# Patient Record
Sex: Female | Born: 1980 | Race: White | Hispanic: No | Marital: Married | Smoking: Never smoker
Health system: Southern US, Community
[De-identification: ages and names within clinical notes are randomized; demographics above are authoritative.]

## PROBLEM LIST (undated history)

## (undated) DIAGNOSIS — A6920 Lyme disease, unspecified: Secondary | ICD-10-CM

## (undated) DIAGNOSIS — E039 Hypothyroidism, unspecified: Secondary | ICD-10-CM

## (undated) HISTORY — PX: WISDOM TOOTH EXTRACTION: SHX21

## (undated) HISTORY — PX: NO PAST SURGERIES: SHX2092

---

## 2010-05-06 ENCOUNTER — Inpatient Hospital Stay (HOSPITAL_COMMUNITY): Admission: AD | Admit: 2010-05-06 | Discharge: 2010-05-10 | Payer: Self-pay | Admitting: Obstetrics and Gynecology

## 2010-05-10 ENCOUNTER — Encounter: Admission: RE | Admit: 2010-05-10 | Discharge: 2010-06-04 | Payer: Self-pay | Admitting: Obstetrics and Gynecology

## 2011-01-22 LAB — CBC
HCT: 39.6 % (ref 36.0–46.0)
MCH: 35.5 pg — ABNORMAL HIGH (ref 26.0–34.0)
MCHC: 35.1 g/dL (ref 30.0–36.0)
MCV: 100 fL (ref 78.0–100.0)
MCV: 101.5 fL — ABNORMAL HIGH (ref 78.0–100.0)
Platelets: 144 10*3/uL — ABNORMAL LOW (ref 150–400)
Platelets: 159 10*3/uL (ref 150–400)
RDW: 13.5 % (ref 11.5–15.5)
RDW: 13.7 % (ref 11.5–15.5)

## 2011-01-22 LAB — RH IMMUNE GLOB WKUP(>/=20WKS)(NOT WOMEN'S HOSP)

## 2012-11-13 ENCOUNTER — Other Ambulatory Visit: Payer: Self-pay | Admitting: Family Medicine

## 2012-11-13 DIAGNOSIS — R1084 Generalized abdominal pain: Secondary | ICD-10-CM

## 2012-11-14 HISTORY — PX: HYSTEROSALPINGOGRAM: SHX6581

## 2012-11-15 ENCOUNTER — Ambulatory Visit
Admission: RE | Admit: 2012-11-15 | Discharge: 2012-11-15 | Disposition: A | Discharge status: Home / Self Care | Payer: BC Managed Care – HMO | Source: Ambulatory Visit | Attending: Family Medicine | Admitting: Family Medicine

## 2012-11-15 DIAGNOSIS — R1084 Generalized abdominal pain: Secondary | ICD-10-CM

## 2013-05-08 LAB — OB RESULTS CONSOLE HGB/HCT, BLOOD
HCT: 39 %
Hemoglobin: 14.2 g/dL

## 2013-05-08 LAB — OB RESULTS CONSOLE RPR: RPR: NONREACTIVE

## 2013-05-08 LAB — OB RESULTS CONSOLE HIV ANTIBODY (ROUTINE TESTING): HIV: NONREACTIVE

## 2013-05-08 LAB — OB RESULTS CONSOLE RUBELLA ANTIBODY, IGM: RUBELLA: IMMUNE

## 2013-05-08 LAB — OB RESULTS CONSOLE ABO/RH: RH TYPE: NEGATIVE

## 2013-05-08 LAB — OB RESULTS CONSOLE GBS: GBS: POSITIVE

## 2013-05-08 LAB — OB RESULTS CONSOLE ANTIBODY SCREEN: Antibody Screen: NEGATIVE

## 2013-05-08 LAB — OB RESULTS CONSOLE HEPATITIS B SURFACE ANTIGEN: HEP B S AG: NEGATIVE

## 2013-05-08 LAB — OB RESULTS CONSOLE PLATELET COUNT: Platelets: 267 10*3/uL

## 2013-06-02 LAB — OB RESULTS CONSOLE GC/CHLAMYDIA
Chlamydia: NEGATIVE
Gonorrhea: NEGATIVE

## 2013-09-26 ENCOUNTER — Encounter (HOSPITAL_COMMUNITY): Payer: Self-pay | Admitting: *Deleted

## 2013-09-26 ENCOUNTER — Inpatient Hospital Stay (HOSPITAL_COMMUNITY)
Admission: AD | Admit: 2013-09-26 | Discharge: 2013-09-27 | Disposition: A | Discharge status: Home / Self Care | Payer: BC Managed Care – PPO | Source: Ambulatory Visit | Attending: Obstetrics and Gynecology | Admitting: Obstetrics and Gynecology

## 2013-09-26 DIAGNOSIS — B373 Candidiasis of vulva and vagina: Secondary | ICD-10-CM

## 2013-09-26 DIAGNOSIS — O2390 Unspecified genitourinary tract infection in pregnancy, unspecified trimester: Secondary | ICD-10-CM | POA: Insufficient documentation

## 2013-09-26 DIAGNOSIS — O99891 Other specified diseases and conditions complicating pregnancy: Secondary | ICD-10-CM | POA: Insufficient documentation

## 2013-09-26 DIAGNOSIS — B3731 Acute candidiasis of vulva and vagina: Secondary | ICD-10-CM

## 2013-09-26 NOTE — MAU Note (Signed)
I was just falling asleep and leaked some water that woke me up. Called on-call nurse and told to come in. No pain. Only leaked fld the one time

## 2013-09-26 NOTE — MAU Provider Note (Signed)
  History     CSN: [id number]  Arrival date and time: 09/26/13 2300   First Provider Initiated Contact with Patient 09/26/13 2348      Chief Complaint  Patient presents with  . Rupture of Membranes   HPI [name] is a 32 y.o. G1P1000 at [redacted]w[redacted]d who presents to MAU today with complaint of ?LOF. The patient noted some clear fluid leaking earlier today. She has not seen any since then. She denies vaginal bleeding or contractions. She has noted some vulvar itching recently. She reports good fetal movement.   OB History   Grav Para Term Preterm Abortions TAB SAB Ect Mult Living   1 1 1       1       Past Medical History  Diagnosis Date  . Anxiety     History reviewed. No pertinent past surgical history.  History reviewed. No pertinent family history.  History  Substance Use Topics  . Smoking status: Never Smoker   . Smokeless tobacco: Not on file  . Alcohol Use: No    Allergies: No Known Allergies  No prescriptions prior to admission    Review of Systems  Constitutional: Negative for fever and malaise/fatigue.  Gastrointestinal: Negative for abdominal pain.  Genitourinary: Negative for dysuria and urgency.       + vaginal discharge, LOF Neg - vaginal bleeding   Physical Exam   Blood pressure 115/66, pulse 72, temperature 97.5 F (36.4 C), resp. rate 18, height 5\' 5"  (1.651 m), weight 150 lb 6.4 oz (68.221 kg), unknown if currently breastfeeding.  Physical Exam  Constitutional: She is oriented to person, place, and time. She appears well-developed and well-nourished. No distress.  HENT:  Head: Normocephalic.  Cardiovascular: Normal rate.   Respiratory: Effort normal.  Genitourinary: No bleeding around the vagina. Vaginal discharge (moderate amount of thick, white discharge coating the vaginal walls) found.  Neurological: She is alert and oriented to person, place, and time.  Skin: Skin is warm and dry. No erythema.  Psychiatric: She has a normal  mood and affect.  Pooling - negative Ferning - negative  Results for orders placed during the hospital encounter of 09/26/13 (from the past 24 hour(s))  WET PREP, GENITAL     Status: Abnormal   Collection Time    09/26/13 11:56 PM      Result Value Range   Yeast Wet Prep HPF POC FEW (*) NONE SEEN   Trich, Wet Prep NONE SEEN  NONE SEEN   Clue Cells Wet Prep HPF POC NONE SEEN  NONE SEEN   WBC, Wet Prep HPF POC MODERATE (*) NONE SEEN    Fetal Monitoring: Baseline: 120 bpm, moderate variability, + accelerations, no decelerations Contractions: None  MAU Course  Procedures None  MDM Discussed patient with Dr. Renaldo Fiddler. Treat yeast vulvovaginitis. Discuss preterm labor precautions and follow-up in the office as scheduled  Assessment and Plan  A: Yeast vulvovaginitis  P: Discharge home Rx for Diflucan given to patient Preterm labor precautions discussed Patient advised to follow-up in the office as scheduled Patient may return to MAU as needed or if her condition were to change or worsen  Brenda Starr, PA-C  09/27/2013, 1:02 AM

## 2013-09-27 ENCOUNTER — Encounter (HOSPITAL_COMMUNITY): Payer: Self-pay | Admitting: Medical

## 2013-09-27 LAB — WET PREP, GENITAL
Clue Cells Wet Prep HPF POC: NONE SEEN
Trich, Wet Prep: NONE SEEN

## 2013-09-27 MED ORDER — FLUCONAZOLE 150 MG PO TABS: 150.0000 mg | ORAL_TABLET | Freq: Every day | ORAL | Status: DC

## 2013-12-08 ENCOUNTER — Inpatient Hospital Stay (HOSPITAL_COMMUNITY): Payer: 59

## 2013-12-08 ENCOUNTER — Encounter (HOSPITAL_COMMUNITY): Payer: Self-pay | Admitting: *Deleted

## 2013-12-08 ENCOUNTER — Inpatient Hospital Stay (HOSPITAL_COMMUNITY)
Admission: AD | Admit: 2013-12-08 | Discharge: 2013-12-09 | Disposition: A | Discharge status: Home / Self Care | Payer: 59 | Source: Ambulatory Visit | Attending: Obstetrics and Gynecology | Admitting: Obstetrics and Gynecology

## 2013-12-08 DIAGNOSIS — O9989 Other specified diseases and conditions complicating pregnancy, childbirth and the puerperium: Principal | ICD-10-CM

## 2013-12-08 DIAGNOSIS — O99891 Other specified diseases and conditions complicating pregnancy: Secondary | ICD-10-CM

## 2013-12-08 DIAGNOSIS — W108XXA Fall (on) (from) other stairs and steps, initial encounter: Secondary | ICD-10-CM | POA: Insufficient documentation

## 2013-12-08 DIAGNOSIS — Y92009 Unspecified place in unspecified non-institutional (private) residence as the place of occurrence of the external cause: Secondary | ICD-10-CM | POA: Insufficient documentation

## 2013-12-08 DIAGNOSIS — M549 Dorsalgia, unspecified: Secondary | ICD-10-CM | POA: Insufficient documentation

## 2013-12-08 DIAGNOSIS — W19XXXA Unspecified fall, initial encounter: Secondary | ICD-10-CM

## 2013-12-08 DIAGNOSIS — O479 False labor, unspecified: Secondary | ICD-10-CM | POA: Insufficient documentation

## 2013-12-08 NOTE — MAU Provider Note (Signed)
  History     CSN: [id number]  Arrival date and time: 12/08/13 2028   First Provider Initiated Contact with Patient 12/08/13 2218      Chief Complaint  Patient presents with  . Fall   HPI [name] is a 33 y.o. G2P1001 at [redacted]w[redacted]d who presents to MAU today after a fall at home. The patient states that she fell going up concrete steps outside her home around 1930 today. She denies vaginal bleeding, discharge, LOF or abdominal pain. She reports good fetal movement. She states mild back pain that she feels is muscle strain from the fall. She reports occasional, mild contractions.   OB History   Grav Para Term Preterm Abortions TAB SAB Ect Mult Living   2 1 1       1       Past Medical History  Diagnosis Date  . Anxiety     History reviewed. No pertinent past surgical history.  History reviewed. No pertinent family history.  History  Substance Use Topics  . Smoking status: Never Smoker   . Smokeless tobacco: Not on file  . Alcohol Use: No    Allergies: No Known Allergies  Prescriptions prior to admission  Medication Sig Dispense Refill  . busPIRone (BUSPAR) 15 MG tablet Take 15 mg by mouth daily.       . Calcium Carb-Cholecalciferol (CALCIUM + D3) 600-200 MG-UNIT TABS Take 1 tablet by mouth 2 (two) times daily.      . calcium carbonate (TUMS - DOSED IN MG ELEMENTAL CALCIUM) 500 MG chewable tablet Chew 2 tablets by mouth 4 (four) times daily as needed for indigestion or heartburn.      . Omega-3 Fatty Acids (FISH OIL PO) Take 1 tablet by mouth 2 (two) times daily.      . Prenatal Vit-Fe Fumarate-FA (PRENATAL MULTIVITAMIN) TABS tablet Take 1 tablet by mouth daily at 12 noon.        Review of Systems  Constitutional: Negative for fever and malaise/fatigue.  Gastrointestinal: Negative for abdominal pain.  Genitourinary:       Neg - vaginal bleeding, LOF   Physical Exam   Blood pressure 118/73, pulse 82, temperature 97.6 F (36.4 C), temperature source Oral,  resp. rate 18, height 5\' 5"  (1.651 m), weight 166 lb 3.2 oz (75.388 kg).  Physical Exam  Constitutional: She appears well-developed and well-nourished. No distress.  HENT:  Head: Normocephalic and atraumatic.  Cardiovascular: Normal rate.   Respiratory: Effort normal.  GI: Soft. She exhibits no distension and no mass. There is no tenderness. There is no rebound and no guarding.  Skin: Skin is warm and dry. No erythema.  Psychiatric: She has a normal mood and affect.    Fetal Monitoring: Baseline: 120 bpm, moderate variability, + accelerations, no decelerations Contractions: irregular, mild MAU Course  Procedures None  MDM Discussed with Dr. Radene Knee. [place] and 4 hours of NST monitoring [place] results show NO placental abruption, normal AFI 4 hour course of fetal monitoring complete. Minimal occasional, irregular contractions noted. Reactive NST Assessment and Plan  A: Fall in pregnancy Reactive NST  P: Discharge home Patient advised to take Tylenol PRN for pain Patient advised to keep scheduled appointment for routine prenatal care Labor precautions and kick counts discussed Patient may return to MAU as needed or if her condition were to change or worsen  Farris Has, PA-C  12/08/2013, 11:48 PM

## 2013-12-08 NOTE — MAU Note (Signed)
Pt c/o irregular uc that are not painful. States when she first fell, it felt as if her stomach would not relax. States that it feels "tighter" than usual.

## 2013-12-08 NOTE — MAU Note (Signed)
Pt G2 P1 at 39.1, fell going upstairs, landed on abdomen.  Pt having soreness, +fetal movement.

## 2013-12-09 NOTE — Progress Notes (Signed)
MFM ultrasound  Indication: 33 yr old G2P1001 at [redacted]w[redacted]d s/p fall for fetal ultrasound. Remote read.  Findings: 1. Single intrauterine pregnancy. 2. Fundal placenta without evidence of previa. No retroplacental bleed is seen. 3. Normal amniotic fluid index.  Recommendations: 1. Normal AFI. 2. No retroplacental bleed is seen. 3. Management per OB team.  [place], MD

## 2013-12-09 NOTE — Discharge Instructions (Signed)
Braxton Hicks Contractions Pregnancy is commonly associated with contractions of the uterus throughout the pregnancy. Towards the end of pregnancy (32 to 34 weeks), these contractions (Braxton Hicks) can develop more often and may become more forceful. This is not true labor because these contractions do not result in opening (dilatation) and thinning of the cervix. They are sometimes difficult to tell apart from true labor because these contractions can be forceful and people have different pain tolerances. You should not feel embarrassed if you go to the hospital with false labor. Sometimes, the only way to tell if you are in true labor is for your caregiver to follow the changes in the cervix. How to tell the difference between true and false labor:  False labor.  The contractions of false labor are usually shorter, irregular and not as hard as those of true labor.  They are often felt in the front of the lower abdomen and in the groin.  They may leave with walking around or changing positions while lying down.  They get weaker and are shorter lasting as time goes on.  These contractions are usually irregular.  They do not usually become progressively stronger, regular and closer together as with true labor.  True labor.  Contractions in true labor last 30 to 70 seconds, become very regular, usually become more intense, and increase in frequency.  They do not go away with walking.  The discomfort is usually felt in the top of the uterus and spreads to the lower abdomen and low back.  True labor can be determined by your caregiver with an exam. This will show that the cervix is dilating and getting thinner. If there are no prenatal problems or other health problems associated with the pregnancy, it is completely safe to be sent home with false labor and await the onset of true labor. HOME CARE INSTRUCTIONS   Keep up with your usual exercises and instructions.  Take medications as  directed.  Keep your regular prenatal appointment.  Eat and drink lightly if you think you are going into labor.  If BH contractions are making you uncomfortable:  Change your activity position from lying down or resting to walking/walking to resting.  Sit and rest in a tub of warm water.  Drink 2 to 3 glasses of water. Dehydration may cause B-H contractions.  Do slow and deep breathing several times an hour. SEEK IMMEDIATE MEDICAL CARE IF:   Your contractions continue to become stronger, more regular, and closer together.  You have a gushing, burst or leaking of fluid from the vagina.  An oral temperature above 102 F (38.9 C) develops.  You have passage of blood-tinged mucus.  You develop vaginal bleeding.  You develop continuous belly (abdominal) pain.  You have low back pain that you never had before.  You feel the baby's head pushing down causing pelvic pressure.  The baby is not moving as much as it used to. Document Released: 10/23/2005 Document Revised: 01/15/2012 Document Reviewed: 08/04/2013 [employer] Patient Information 2014 [employer].  Fetal Movement Counts Patient Name: __________________________________________________ Patient Due Date: ____________________ Performing a fetal movement count is highly recommended in high-risk pregnancies, but it is good for every pregnant woman to do. Your caregiver may ask you to start counting fetal movements at 28 weeks of the pregnancy. Fetal movements often increase:  After eating a full meal.  After physical activity.  After eating or drinking something sweet or cold.  At rest. Pay attention to when you feel   the baby is most active. This will help you notice a pattern of your baby's sleep and wake cycles and what factors contribute to an increase in fetal movement. It is important to perform a fetal movement count at the same time each day when your baby is normally most active.  HOW TO COUNT FETAL  MOVEMENTS 1. Find a quiet and comfortable area to sit or lie down on your left side. Lying on your left side provides the best blood and oxygen circulation to your baby. 2. Write down the day and time on a sheet of paper or in a journal. 3. Start counting kicks, flutters, swishes, rolls, or jabs in a 2 hour period. You should feel at least 10 movements within 2 hours. 4. If you do not feel 10 movements in 2 hours, wait 2 3 hours and count again. Look for a change in the pattern or not enough counts in 2 hours. SEEK MEDICAL CARE IF:  You feel less than 10 counts in 2 hours, tried twice.  There is no movement in over an hour.  The pattern is changing or taking longer each day to reach 10 counts in 2 hours.  You feel the baby is not moving as he or she usually does. Date: ____________ Movements: ____________ Start time: ____________ Finish time: ____________  Date: ____________ Movements: ____________ Start time: ____________ Finish time: ____________ Date: ____________ Movements: ____________ Start time: ____________ Finish time: ____________ Date: ____________ Movements: ____________ Start time: ____________ Finish time: ____________ Date: ____________ Movements: ____________ Start time: ____________ Finish time: ____________ Date: ____________ Movements: ____________ Start time: ____________ Finish time: ____________ Date: ____________ Movements: ____________ Start time: ____________ Finish time: ____________ Date: ____________ Movements: ____________ Start time: ____________ Finish time: ____________  Date: ____________ Movements: ____________ Start time: ____________ Finish time: ____________ Date: ____________ Movements: ____________ Start time: ____________ Finish time: ____________ Date: ____________ Movements: ____________ Start time: ____________ Finish time: ____________ Date: ____________ Movements: ____________ Start time: ____________ Finish time: ____________ Date: ____________  Movements: ____________ Start time: ____________ Finish time: ____________ Date: ____________ Movements: ____________ Start time: ____________ Finish time: ____________ Date: ____________ Movements: ____________ Start time: ____________ Finish time: ____________  Date: ____________ Movements: ____________ Start time: ____________ Finish time: ____________ Date: ____________ Movements: ____________ Start time: ____________ Finish time: ____________ Date: ____________ Movements: ____________ Start time: ____________ Finish time: ____________ Date: ____________ Movements: ____________ Start time: ____________ Finish time: ____________ Date: ____________ Movements: ____________ Start time: ____________ Finish time: ____________ Date: ____________ Movements: ____________ Start time: ____________ Finish time: ____________ Date: ____________ Movements: ____________ Start time: ____________ Finish time: ____________  Date: ____________ Movements: ____________ Start time: ____________ Finish time: ____________ Date: ____________ Movements: ____________ Start time: ____________ Finish time: ____________ Date: ____________ Movements: ____________ Start time: ____________ Finish time: ____________ Date: ____________ Movements: ____________ Start time: ____________ Finish time: ____________ Date: ____________ Movements: ____________ Start time: ____________ Finish time: ____________ Date: ____________ Movements: ____________ Start time: ____________ Finish time: ____________ Date: ____________ Movements: ____________ Start time: ____________ Finish time: ____________  Date: ____________ Movements: ____________ Start time: ____________ Finish time: ____________ Date: ____________ Movements: ____________ Start time: ____________ Finish time: ____________ Date: ____________ Movements: ____________ Start time: ____________ Finish time: ____________ Date: ____________ Movements: ____________ Start time:  ____________ Finish time: ____________ Date: ____________ Movements: ____________ Start time: ____________ Finish time: ____________ Date: ____________ Movements: ____________ Start time: ____________ Finish time: ____________ Date: ____________ Movements: ____________ Start time: ____________ Finish time: ____________  Date: ____________ Movements: ____________ Start time: ____________ Finish time: ____________ Date: ____________ Movements: ____________ Start   time: ____________ Finish time: ____________ Date: ____________ Movements: ____________ Start time: ____________ Finish time: ____________ Date: ____________ Movements: ____________ Start time: ____________ Finish time: ____________ Date: ____________ Movements: ____________ Start time: ____________ Finish time: ____________ Date: ____________ Movements: ____________ Start time: ____________ Finish time: ____________ Date: ____________ Movements: ____________ Start time: ____________ Finish time: ____________  Date: ____________ Movements: ____________ Start time: ____________ Finish time: ____________ Date: ____________ Movements: ____________ Start time: ____________ Finish time: ____________ Date: ____________ Movements: ____________ Start time: ____________ Finish time: ____________ Date: ____________ Movements: ____________ Start time: ____________ Finish time: ____________ Date: ____________ Movements: ____________ Start time: ____________ Finish time: ____________ Date: ____________ Movements: ____________ Start time: ____________ Finish time: ____________ Date: ____________ Movements: ____________ Start time: ____________ Finish time: ____________  Date: ____________ Movements: ____________ Start time: ____________ Finish time: ____________ Date: ____________ Movements: ____________ Start time: ____________ Finish time: ____________ Date: ____________ Movements: ____________ Start time: ____________ Finish time: ____________ Date:  ____________ Movements: ____________ Start time: ____________ Finish time: ____________ Date: ____________ Movements: ____________ Start time: ____________ Finish time: ____________ Date: ____________ Movements: ____________ Start time: ____________ Finish time: ____________ Document Released: 11/22/2006 Document Revised: 10/09/2012 Document Reviewed: 08/19/2012 [employer] Patient Information 2014 [employer].  

## 2013-12-10 ENCOUNTER — Encounter (HOSPITAL_COMMUNITY): Payer: Self-pay | Admitting: *Deleted

## 2013-12-10 ENCOUNTER — Inpatient Hospital Stay (HOSPITAL_COMMUNITY): Payer: 59 | Admitting: Anesthesiology

## 2013-12-10 ENCOUNTER — Encounter (HOSPITAL_COMMUNITY): Payer: 59 | Admitting: Anesthesiology

## 2013-12-10 ENCOUNTER — Inpatient Hospital Stay (HOSPITAL_COMMUNITY)
Admission: AD | Admit: 2013-12-10 | Discharge: 2013-12-12 | DRG: 775 | Disposition: A | Discharge status: Home / Self Care | Payer: 59 | Source: Ambulatory Visit | Attending: Obstetrics and Gynecology | Admitting: Obstetrics and Gynecology

## 2013-12-10 DIAGNOSIS — O9989 Other specified diseases and conditions complicating pregnancy, childbirth and the puerperium: Secondary | ICD-10-CM

## 2013-12-10 DIAGNOSIS — IMO0001 Reserved for inherently not codable concepts without codable children: Secondary | ICD-10-CM

## 2013-12-10 DIAGNOSIS — Z22330 Carrier of Group B streptococcus: Secondary | ICD-10-CM

## 2013-12-10 DIAGNOSIS — O99344 Other mental disorders complicating childbirth: Secondary | ICD-10-CM | POA: Diagnosis present

## 2013-12-10 DIAGNOSIS — F411 Generalized anxiety disorder: Secondary | ICD-10-CM | POA: Diagnosis present

## 2013-12-10 DIAGNOSIS — O99892 Other specified diseases and conditions complicating childbirth: Secondary | ICD-10-CM | POA: Diagnosis present

## 2013-12-10 LAB — CBC
HEMATOCRIT: 39.1 % (ref 36.0–46.0)
HEMOGLOBIN: 14.3 g/dL (ref 12.0–15.0)
MCH: 34.7 pg — AB (ref 26.0–34.0)
MCHC: 36.6 g/dL — AB (ref 30.0–36.0)
MCV: 94.9 fL (ref 78.0–100.0)
Platelets: 179 10*3/uL (ref 150–400)
RBC: 4.12 MIL/uL (ref 3.87–5.11)
RDW: 12.4 % (ref 11.5–15.5)
WBC: 17.7 10*3/uL — ABNORMAL HIGH (ref 4.0–10.5)

## 2013-12-10 MED ORDER — EPHEDRINE 5 MG/ML INJ
10.0000 mg | INTRAVENOUS | Status: DC | PRN
  Filled 2013-12-10: qty 2
  Filled 2013-12-10: qty 4

## 2013-12-10 MED ORDER — DIPHENHYDRAMINE HCL 50 MG/ML IJ SOLN: 12.5000 mg | INTRAMUSCULAR | Status: DC | PRN

## 2013-12-10 MED ORDER — MEASLES, MUMPS & RUBELLA VAC ~~LOC~~ INJ
0.5000 mL | INJECTION | Freq: Once | SUBCUTANEOUS | Status: DC
Start: 2013-12-11 — End: 2013-12-12
  Filled 2013-12-10: qty 0.5

## 2013-12-10 MED ORDER — OXYCODONE-ACETAMINOPHEN 5-325 MG PO TABS
1.0000 | ORAL_TABLET | ORAL | Status: DC | PRN
  Administered 2013-12-11 (×2): 2 via ORAL
  Filled 2013-12-10 (×2): qty 2

## 2013-12-10 MED ORDER — BUPIVACAINE HCL (PF) 0.25 % IJ SOLN
INTRAMUSCULAR | Status: DC | PRN
  Administered 2013-12-10 (×2): 5 mL

## 2013-12-10 MED ORDER — IBUPROFEN 600 MG PO TABS: 600.0000 mg | ORAL_TABLET | Freq: Four times a day (QID) | ORAL | Status: DC | PRN

## 2013-12-10 MED ORDER — ONDANSETRON HCL 4 MG/2ML IJ SOLN
4.0000 mg | Freq: Four times a day (QID) | INTRAMUSCULAR | Status: DC | PRN
Start: 2013-12-10 — End: 2013-12-10
  Administered 2013-12-10: 4 mg via INTRAVENOUS
  Filled 2013-12-10: qty 2

## 2013-12-10 MED ORDER — CITRIC ACID-SODIUM CITRATE 334-500 MG/5ML PO SOLN: 30.0000 mL | ORAL | Status: DC | PRN

## 2013-12-10 MED ORDER — LACTATED RINGERS IV SOLN
INTRAVENOUS | Status: DC
  Administered 2013-12-10: 125 mL/h via INTRAVENOUS

## 2013-12-10 MED ORDER — OXYCODONE-ACETAMINOPHEN 5-325 MG PO TABS: 1.0000 | ORAL_TABLET | ORAL | Status: DC | PRN

## 2013-12-10 MED ORDER — FENTANYL 2.5 MCG/ML BUPIVACAINE 1/10 % EPIDURAL INFUSION (WH - ANES)
14.0000 mL/h | INTRAMUSCULAR | Status: DC | PRN
  Administered 2013-12-10: 14 mL/h via EPIDURAL
  Filled 2013-12-10: qty 125

## 2013-12-10 MED ORDER — LIDOCAINE HCL (PF) 1 % IJ SOLN
30.0000 mL | INTRAMUSCULAR | Status: DC | PRN
  Filled 2013-12-10 (×2): qty 30

## 2013-12-10 MED ORDER — ACETAMINOPHEN 325 MG PO TABS
650.0000 mg | ORAL_TABLET | ORAL | Status: DC | PRN
  Administered 2013-12-10: 650 mg via ORAL
  Filled 2013-12-10: qty 2

## 2013-12-10 MED ORDER — DIBUCAINE 1 % RE OINT: 1.0000 "application " | TOPICAL_OINTMENT | RECTAL | Status: DC | PRN

## 2013-12-10 MED ORDER — PHENYLEPHRINE 40 MCG/ML (10ML) SYRINGE FOR IV PUSH (FOR BLOOD PRESSURE SUPPORT)
80.0000 ug | PREFILLED_SYRINGE | INTRAVENOUS | Status: DC | PRN
Start: 2013-12-10 — End: 2013-12-10
  Filled 2013-12-10: qty 2

## 2013-12-10 MED ORDER — PHENYLEPHRINE 40 MCG/ML (10ML) SYRINGE FOR IV PUSH (FOR BLOOD PRESSURE SUPPORT)
80.0000 ug | PREFILLED_SYRINGE | INTRAVENOUS | Status: DC | PRN
  Filled 2013-12-10: qty 10
  Filled 2013-12-10: qty 2

## 2013-12-10 MED ORDER — WITCH HAZEL-GLYCERIN EX PADS: 1.0000 "application " | MEDICATED_PAD | CUTANEOUS | Status: DC | PRN

## 2013-12-10 MED ORDER — ONDANSETRON HCL 4 MG PO TABS: 4.0000 mg | ORAL_TABLET | ORAL | Status: DC | PRN

## 2013-12-10 MED ORDER — MEDROXYPROGESTERONE ACETATE 150 MG/ML IM SUSP: 150.0000 mg | INTRAMUSCULAR | Status: DC | PRN

## 2013-12-10 MED ORDER — DEXTROSE 5 % IV SOLN
5.0000 10*6.[IU] | Freq: Once | INTRAVENOUS | Status: AC
  Administered 2013-12-10: 5 10*6.[IU] via INTRAVENOUS
  Filled 2013-12-10: qty 5

## 2013-12-10 MED ORDER — LANOLIN HYDROUS EX OINT: TOPICAL_OINTMENT | CUTANEOUS | Status: DC | PRN

## 2013-12-10 MED ORDER — IBUPROFEN 600 MG PO TABS
600.0000 mg | ORAL_TABLET | Freq: Four times a day (QID) | ORAL | Status: DC
  Administered 2013-12-10 – 2013-12-12 (×6): 600 mg via ORAL
  Filled 2013-12-10 (×6): qty 1

## 2013-12-10 MED ORDER — SIMETHICONE 80 MG PO CHEW: 80.0000 mg | CHEWABLE_TABLET | ORAL | Status: DC | PRN

## 2013-12-10 MED ORDER — BUSPIRONE HCL 15 MG PO TABS
15.0000 mg | ORAL_TABLET | Freq: Every day | ORAL | Status: DC
  Administered 2013-12-11: 15 mg via ORAL
  Filled 2013-12-10 (×2): qty 1

## 2013-12-10 MED ORDER — PENICILLIN G POTASSIUM 5000000 UNITS IJ SOLR
2.5000 10*6.[IU] | INTRAVENOUS | Status: DC
  Administered 2013-12-10: 2.5 10*6.[IU] via INTRAVENOUS
  Filled 2013-12-10 (×3): qty 2.5

## 2013-12-10 MED ORDER — DIPHENHYDRAMINE HCL 25 MG PO CAPS: 25.0000 mg | ORAL_CAPSULE | Freq: Four times a day (QID) | ORAL | Status: DC | PRN

## 2013-12-10 MED ORDER — OXYTOCIN BOLUS FROM INFUSION
500.0000 mL | INTRAVENOUS | Status: DC
  Administered 2013-12-10: 500 mL via INTRAVENOUS

## 2013-12-10 MED ORDER — EPHEDRINE 5 MG/ML INJ
10.0000 mg | INTRAVENOUS | Status: DC | PRN
  Filled 2013-12-10: qty 2

## 2013-12-10 MED ORDER — BENZOCAINE-MENTHOL 20-0.5 % EX AERO
1.0000 "application " | INHALATION_SPRAY | CUTANEOUS | Status: DC | PRN
  Administered 2013-12-10: 1 via TOPICAL
  Filled 2013-12-10: qty 56

## 2013-12-10 MED ORDER — PRENATAL MULTIVITAMIN CH
1.0000 | ORAL_TABLET | Freq: Every day | ORAL | Status: DC
  Filled 2013-12-10: qty 1

## 2013-12-10 MED ORDER — OXYTOCIN 40 UNITS IN LACTATED RINGERS INFUSION - SIMPLE MED
62.5000 mL/h | INTRAVENOUS | Status: DC
  Filled 2013-12-10: qty 1000

## 2013-12-10 MED ORDER — ONDANSETRON HCL 4 MG/2ML IJ SOLN: 4.0000 mg | INTRAMUSCULAR | Status: DC | PRN

## 2013-12-10 MED ORDER — LACTATED RINGERS IV SOLN
500.0000 mL | Freq: Once | INTRAVENOUS | Status: DC
Start: 2013-12-10 — End: 2013-12-10

## 2013-12-10 MED ORDER — SENNOSIDES-DOCUSATE SODIUM 8.6-50 MG PO TABS
2.0000 | ORAL_TABLET | ORAL | Status: DC
  Administered 2013-12-10 – 2013-12-11 (×2): 2 via ORAL
  Filled 2013-12-10 (×2): qty 2

## 2013-12-10 MED ORDER — LACTATED RINGERS IV SOLN
500.0000 mL | INTRAVENOUS | Status: DC | PRN
  Administered 2013-12-10: 1000 mL via INTRAVENOUS

## 2013-12-10 MED ORDER — TETANUS-DIPHTH-ACELL PERTUSSIS 5-2.5-18.5 LF-MCG/0.5 IM SUSP: 0.5000 mL | Freq: Once | INTRAMUSCULAR | Status: DC

## 2013-12-10 MED ORDER — LIDOCAINE HCL (PF) 1 % IJ SOLN
INTRAMUSCULAR | Status: DC | PRN
  Administered 2013-12-10 (×4): 4 mL

## 2013-12-10 NOTE — Anesthesia Preprocedure Evaluation (Signed)
Anesthesia Evaluation  Patient identified by MRN, date of birth, ID band Patient awake    Reviewed: Allergy & Precautions, H&P , NPO status , Patient's Chart, lab work & pertinent test results, reviewed documented beta blocker date and time   History of Anesthesia Complications Negative for: history of anesthetic complications  Airway Mallampati: III TM Distance: >3 FB Neck ROM: full    Dental  (+) Teeth Intact   Pulmonary neg pulmonary ROS,  breath sounds clear to auscultation        Cardiovascular negative cardio ROS  Rhythm:regular Rate:Normal     Neuro/Psych PSYCHIATRIC DISORDERS (anxiety) negative neurological ROS     GI/Hepatic negative GI ROS, Neg liver ROS,   Endo/Other  negative endocrine ROS  Renal/GU negative Renal ROS     Musculoskeletal   Abdominal   Peds  Hematology negative hematology ROS (+)   Anesthesia Other Findings   Reproductive/Obstetrics (+) Pregnancy                           Anesthesia Physical Anesthesia Plan  ASA: II  Anesthesia Plan: Epidural   Post-op Pain Management:    Induction:   Airway Management Planned:   Additional Equipment:   Intra-op Plan:   Post-operative Plan:   Informed Consent: I have reviewed the patients History and Physical, chart, labs and discussed the procedure including the risks, benefits and alternatives for the proposed anesthesia with the patient or authorized representative who has indicated his/her understanding and acceptance.     Plan Discussed with:   Anesthesia Plan Comments:         Anesthesia Quick Evaluation

## 2013-12-10 NOTE — Progress Notes (Signed)
SVD of vigerous female infant w/ apgars of 8,9.  Mild <18min shoulder dystocia resolved with McRoberts and suprapubic pressure Placenta delivered spontaneous w/ 3VC.   2nd degree MLE repaired w/ 3-0 vicryl rapide.  Fundus firm.  EBL 400cc .

## 2013-12-10 NOTE — Anesthesia Procedure Notes (Signed)
Epidural Patient location during procedure: OB Start time: 12/10/2013 2:56 PM  Staffing Performed by: anesthesiologist   Preanesthetic Checklist Completed: patient identified, site marked, surgical consent, pre-op evaluation, timeout performed, IV checked, risks and benefits discussed and monitors and equipment checked  Epidural Patient position: sitting Prep: site prepped and draped and DuraPrep Patient monitoring: continuous pulse ox and blood pressure Approach: midline Injection technique: LOR air  Needle:  Needle type: Tuohy  Needle gauge: 17 G Needle length: 9 cm and 9 Needle insertion depth: 4 cm Catheter type: closed end flexible Catheter size: 19 Gauge Catheter at skin depth: 9 cm Test dose: negative  Assessment Events: blood not aspirated, injection not painful, no injection resistance, negative IV test and no paresthesia  Additional Notes Discussed risk of headache, infection, bleeding, nerve injury and failed or incomplete block.  Patient voices understanding and wishes to proceed.  Epidural placed easily on first attempt. No paresthesia.  Patient tolerated procedure well with no apparent complications.  Charlton Haws, MDReason for block:procedure for pain

## 2013-12-10 NOTE — H&P (Signed)
Brenda Bryant is a 33 y.o. female presenting for labor.  Pt reports contractions began overnight and have become stronger and closer throughout the day.  No lof or vb.  Pregnancy uncomplicated.  History OB History   Grav Para Term Preterm Abortions TAB SAB Ect Mult Living   6 1 1  4  4   1      Past Medical History  Diagnosis Date  . Anxiety    History reviewed. No pertinent past surgical history. Family History: family history is not on file. Social History:  reports that she has never smoked. She has never used smokeless tobacco. She reports that she does not drink alcohol or use illicit drugs.   Prenatal Transfer Tool  Maternal Diabetes: No Genetic Screening: Normal Maternal Ultrasounds/Referrals: Normal Fetal Ultrasounds or other Referrals:  None Maternal Substance Abuse:  No Significant Maternal Medications:  None Significant Maternal Lab Results:  None Other Comments:  None  ROS  Dilation: 6 Effacement (%): 100 Station: 0 Exam by:: Philis Pique, RN Blood pressure 109/57, pulse 76, temperature 98.1 F (36.7 C), temperature source Oral, resp. rate 18, height 5\' 5"  (1.651 m), weight 74.844 kg (165 lb), SpO2 100.00%. Exam Physical Exam  NAD - uncomfortable with ctx Abd - gravid, firm during ctx, NT Cvx - 3-4 cm on admit.  8 cm now Prenatal labs: ABO, Rh: A/Negative/-- (07/03 0000) Antibody: Negative (07/03 0000) Rubella: Immune (07/03 0000) RPR: Nonreactive (07/03 0000)  HBsAg: Negative (07/03 0000)  HIV: Non-reactive (07/03 0000)  GBS: Positive (07/03 0000)   Assessment/Plan: Admit Epidural prn Exp mngt PCN   Elan Mcelvain 12/10/2013, 5:18 PM

## 2013-12-10 NOTE — MAU Note (Signed)
Contractions, maybe every 5 min. No bleeding or leaking.  Was 3/90 when last checked.

## 2013-12-11 ENCOUNTER — Encounter (HOSPITAL_COMMUNITY): Payer: Self-pay

## 2013-12-11 LAB — CBC
HCT: 35.2 % — ABNORMAL LOW (ref 36.0–46.0)
HEMOGLOBIN: 12.6 g/dL (ref 12.0–15.0)
MCH: 34.5 pg — ABNORMAL HIGH (ref 26.0–34.0)
MCHC: 35.8 g/dL (ref 30.0–36.0)
MCV: 96.4 fL (ref 78.0–100.0)
Platelets: 169 10*3/uL (ref 150–400)
RBC: 3.65 MIL/uL — AB (ref 3.87–5.11)
RDW: 12.6 % (ref 11.5–15.5)
WBC: 20 10*3/uL — ABNORMAL HIGH (ref 4.0–10.5)

## 2013-12-11 LAB — KLEIHAUER-BETKE STAIN
# Vials RhIg: 1
Fetal Cells %: 0 %
Quantitation Fetal Hemoglobin: 0 mL

## 2013-12-11 LAB — RPR: RPR Ser Ql: NONREACTIVE

## 2013-12-11 MED ORDER — RHO D IMMUNE GLOBULIN 1500 UNIT/2ML IJ SOLN
300.0000 ug | Freq: Once | INTRAMUSCULAR | Status: AC
  Administered 2013-12-11: 300 ug via INTRAMUSCULAR
  Filled 2013-12-11: qty 2

## 2013-12-11 NOTE — Progress Notes (Signed)
Post Partum Day 1 Subjective: no complaints and up ad lib  Objective: Blood pressure 100/64, pulse 70, temperature 97.7 F (36.5 C), temperature source Oral, resp. rate 18, height 5\' 5"  (1.651 m), weight 165 lb (74.844 kg), SpO2 100.00%, unknown if currently breastfeeding.  Physical Exam:  General: alert and cooperative Lochia: appropriate Uterine Fundus: firm Incision: perineum intact DVT Evaluation: No evidence of DVT seen on physical exam. Negative Homan's sign. No cords or calf tenderness. No significant calf/ankle edema.   Recent Labs  12/10/13 1350 12/11/13 0600  HGB 14.3 12.6  HCT 39.1 35.2*    Assessment/Plan: Plan for discharge tomorrow and Circumcision prior to discharge   LOS: 1 day   CURTIS,CAROL G 12/11/2013, 8:14 AM

## 2013-12-11 NOTE — Anesthesia Postprocedure Evaluation (Signed)
  Anesthesia Post-op Note  Patient: Brenda Bryant  Procedure(s) Performed: * No procedures listed *  Patient Location: PACU and Mother/Baby  Anesthesia Type:Epidural  Level of Consciousness: awake, alert  and oriented  Airway and Oxygen Therapy: Patient Spontanous Breathing  Post-op Pain: none  Post-op Assessment: Post-op Vital signs reviewed, Patient's Cardiovascular Status Stable, No headache, No backache, No residual numbness and No residual motor weakness  Post-op Vital Signs: Reviewed and stable  Complications: No apparent anesthesia complications

## 2013-12-11 NOTE — Progress Notes (Signed)
Patient was referred for history of depression/anxiety. * Referral screened out by Clinical Social Worker because none of the following criteria appear to apply: ~ History of anxiety/depression during this pregnancy, or of post-partum depression. ~ Diagnosis of anxiety and/or depression within last 3 years ~ History of depression due to pregnancy loss/loss of child OR * Patient's symptoms currently being treated with medication and/or therapy. Please contact the Clinical Social Worker if needs arise, or if patient requests.  MOB has rx for Buspar.

## 2013-12-11 NOTE — Lactation Note (Signed)
This note was copied from the chart of [facility]. Lactation Consultation Note Follow up  visit at 26 hours of age.  Mom Reports just finishing a feeding, baby asleep in dads arms.  Mom complains of sore nipples and feels baby is not opening mouth big enough for latch.  Encouraged mom to awaken baby and techniques taught.  Encouraged mom to use EBM on nipples.  Encouraged to feed with early cues and offer skin to skin if baby does not wake after 3 hours.  Baby had circumcision this evening.  Mom to call for assist as needed.    Patient Name: Brenda Bryant Today's Date: 12/11/2013 Reason for consult: Initial assessment   Maternal Data Formula Feeding for Exclusion: No Has patient been taught Hand Expression?: Yes Does the patient have breastfeeding experience prior to this delivery?: Yes  Feeding Feeding Type: Breast Fed Length of feed: 0 min (attempted. to sleepy)  LATCH Score/Interventions                Intervention(s): Breastfeeding basics reviewed     Lactation Tools Discussed/Used     Consult Status Consult Status: Follow-up Date: 12/12/13 Follow-up type: In-patient    Justice Britain 12/11/2013, 10:02 PM

## 2013-12-12 LAB — RH IG WORKUP (INCLUDES ABO/RH)
ABO/RH(D): A NEG
Antibody Screen: NEGATIVE
Gestational Age(Wks): 39.4
UNIT DIVISION: 0

## 2013-12-12 MED ORDER — PRENATAL MULTIVITAMIN CH: 1.0000 | ORAL_TABLET | Freq: Every day | ORAL | Status: DC

## 2013-12-12 MED ORDER — IBUPROFEN 600 MG PO TABS: 600.0000 mg | ORAL_TABLET | Freq: Four times a day (QID) | ORAL | Status: DC

## 2013-12-12 MED ORDER — BUSPIRONE HCL 15 MG PO TABS: 15.0000 mg | ORAL_TABLET | Freq: Every day | ORAL | Status: DC

## 2013-12-12 NOTE — Discharge Summary (Signed)
Obstetric Discharge Summary Reason for Admission: onset of labor Prenatal Procedures: ultrasound Intrapartum Procedures: spontaneous vaginal delivery Postpartum Procedures: none Complications-Operative and Postpartum: 2 degree perineal laceration Hemoglobin  Date Value Range Status  12/11/2013 12.6  12.0 - 15.0 g/dL Final  05/08/2013 14.2   Final     HCT  Date Value Range Status  12/11/2013 35.2* 36.0 - 46.0 % Final  05/08/2013 39   Final    Physical Exam:  General: alert and cooperative Lochia: appropriate Uterine Fundus: firm Incision: perineum intact DVT Evaluation: No evidence of DVT seen on physical exam. Negative Homan's sign. No cords or calf tenderness. No significant calf/ankle edema.  Discharge Diagnoses: Term Pregnancy-delivered  Discharge Information: Date: 12/12/2013 Activity: pelvic rest Diet: routine Medications: PNV, Ibuprofen and buspar Condition: stable Instructions: refer to practice specific booklet Discharge to: home   Newborn Data: Live born female  Birth Weight: 8 lb 12.6 oz (3986 g) APGAR: 8, 9  Home with mother.  Kermitt Harjo G 12/12/2013, 8:20 AM

## 2014-09-07 ENCOUNTER — Encounter (HOSPITAL_COMMUNITY): Payer: Self-pay

## 2014-11-06 NOTE — L&D Delivery Note (Signed)
Delivery Note  SVD viable female Apgars 9,9 over intact perineum.  Placenta delivered spontaneously intact with 3VC. Good support and hemostasis noted and R/V exam confirms.  PH art was not done.  [place] cord blood was not done.  Mother and baby were doing well.  EBL 100cc  Louretta Shorten, MD

## 2014-11-18 LAB — OB RESULTS CONSOLE RPR: RPR: NONREACTIVE

## 2014-11-18 LAB — OB RESULTS CONSOLE ABO/RH: RH Type: NEGATIVE

## 2014-11-18 LAB — OB RESULTS CONSOLE GBS: GBS: POSITIVE

## 2014-11-18 LAB — OB RESULTS CONSOLE GC/CHLAMYDIA
Chlamydia: NEGATIVE
Gonorrhea: NEGATIVE

## 2014-11-18 LAB — OB RESULTS CONSOLE HIV ANTIBODY (ROUTINE TESTING): HIV: NONREACTIVE

## 2014-11-18 LAB — OB RESULTS CONSOLE RUBELLA ANTIBODY, IGM: RUBELLA: NON-IMMUNE/NOT IMMUNE

## 2015-05-09 LAB — OB RESULTS CONSOLE HEPATITIS B SURFACE ANTIGEN: HEP B S AG: NEGATIVE

## 2015-05-09 LAB — OB RESULTS CONSOLE RUBELLA ANTIBODY, IGM: RUBELLA: NON-IMMUNE/NOT IMMUNE

## 2015-05-28 ENCOUNTER — Encounter (HOSPITAL_COMMUNITY): Payer: Self-pay | Admitting: *Deleted

## 2015-05-28 ENCOUNTER — Inpatient Hospital Stay (HOSPITAL_COMMUNITY)
Admission: AD | Admit: 2015-05-28 | Discharge: 2015-05-30 | DRG: 775 | Disposition: A | Discharge status: Home / Self Care | Payer: PRIVATE HEALTH INSURANCE | Source: Ambulatory Visit | Attending: Obstetrics and Gynecology | Admitting: Obstetrics and Gynecology

## 2015-05-28 ENCOUNTER — Inpatient Hospital Stay (HOSPITAL_COMMUNITY): Payer: PRIVATE HEALTH INSURANCE | Admitting: Anesthesiology

## 2015-05-28 DIAGNOSIS — Z3A38 38 weeks gestation of pregnancy: Secondary | ICD-10-CM | POA: Diagnosis present

## 2015-05-28 DIAGNOSIS — O09523 Supervision of elderly multigravida, third trimester: Secondary | ICD-10-CM

## 2015-05-28 DIAGNOSIS — O99824 Streptococcus B carrier state complicating childbirth: Secondary | ICD-10-CM | POA: Diagnosis present

## 2015-05-28 DIAGNOSIS — IMO0001 Reserved for inherently not codable concepts without codable children: Secondary | ICD-10-CM

## 2015-05-28 LAB — CBC
HCT: 37.9 % (ref 36.0–46.0)
HEMOGLOBIN: 13.6 g/dL (ref 12.0–15.0)
MCH: 34.9 pg — ABNORMAL HIGH (ref 26.0–34.0)
MCHC: 35.9 g/dL (ref 30.0–36.0)
MCV: 97.2 fL (ref 78.0–100.0)
PLATELETS: 174 10*3/uL (ref 150–400)
RBC: 3.9 MIL/uL (ref 3.87–5.11)
RDW: 13.3 % (ref 11.5–15.5)
WBC: 17.9 10*3/uL — ABNORMAL HIGH (ref 4.0–10.5)

## 2015-05-28 MED ORDER — LACTATED RINGERS IV SOLN
500.0000 mL | INTRAVENOUS | Status: DC | PRN
  Administered 2015-05-28: 500 mL via INTRAVENOUS
  Administered 2015-05-29: 300 mL via INTRAVENOUS
  Administered 2015-05-29: 500 mL via INTRAVENOUS

## 2015-05-28 MED ORDER — CITRIC ACID-SODIUM CITRATE 334-500 MG/5ML PO SOLN: 30.0000 mL | ORAL | Status: DC | PRN

## 2015-05-28 MED ORDER — OXYTOCIN BOLUS FROM INFUSION
500.0000 mL | INTRAVENOUS | Status: DC
  Administered 2015-05-29: 500 mL via INTRAVENOUS

## 2015-05-28 MED ORDER — PHENYLEPHRINE 40 MCG/ML (10ML) SYRINGE FOR IV PUSH (FOR BLOOD PRESSURE SUPPORT)
80.0000 ug | PREFILLED_SYRINGE | INTRAVENOUS | Status: AC | PRN
  Administered 2015-05-29 (×3): 80 ug via INTRAVENOUS
  Filled 2015-05-28: qty 20

## 2015-05-28 MED ORDER — ONDANSETRON HCL 4 MG/2ML IJ SOLN
4.0000 mg | Freq: Four times a day (QID) | INTRAMUSCULAR | Status: DC | PRN
  Administered 2015-05-29: 4 mg via INTRAVENOUS
  Filled 2015-05-28: qty 2

## 2015-05-28 MED ORDER — LIDOCAINE HCL (PF) 1 % IJ SOLN
30.0000 mL | INTRAMUSCULAR | Status: DC | PRN
  Filled 2015-05-28: qty 30

## 2015-05-28 MED ORDER — PENICILLIN G POTASSIUM 5000000 UNITS IJ SOLR
2.5000 10*6.[IU] | INTRAVENOUS | Status: DC
  Administered 2015-05-29 (×2): 2.5 10*6.[IU] via INTRAVENOUS
  Filled 2015-05-28 (×4): qty 2.5

## 2015-05-28 MED ORDER — LIDOCAINE HCL (PF) 1 % IJ SOLN
INTRAMUSCULAR | Status: DC | PRN
  Administered 2015-05-28: 6 mL
  Administered 2015-05-28 (×2): 2 mL

## 2015-05-28 MED ORDER — OXYTOCIN 40 UNITS IN LACTATED RINGERS INFUSION - SIMPLE MED
62.5000 mL/h | INTRAVENOUS | Status: DC
  Administered 2015-05-29: 62.5 mL/h via INTRAVENOUS
  Filled 2015-05-28: qty 1000

## 2015-05-28 MED ORDER — DIPHENHYDRAMINE HCL 50 MG/ML IJ SOLN: 12.5000 mg | INTRAMUSCULAR | Status: DC | PRN

## 2015-05-28 MED ORDER — PENICILLIN G POTASSIUM 5000000 UNITS IJ SOLR
5.0000 10*6.[IU] | Freq: Once | INTRAVENOUS | Status: AC
  Administered 2015-05-28: 5 10*6.[IU] via INTRAVENOUS
  Filled 2015-05-28: qty 5

## 2015-05-28 MED ORDER — ACETAMINOPHEN 325 MG PO TABS: 650.0000 mg | ORAL_TABLET | ORAL | Status: DC | PRN

## 2015-05-28 MED ORDER — LACTATED RINGERS IV SOLN
INTRAVENOUS | Status: DC
  Administered 2015-05-28 – 2015-05-29 (×3): via INTRAVENOUS

## 2015-05-28 MED ORDER — OXYCODONE-ACETAMINOPHEN 5-325 MG PO TABS
1.0000 | ORAL_TABLET | ORAL | Status: DC | PRN
Start: 2015-05-28 — End: 2015-05-29

## 2015-05-28 MED ORDER — EPHEDRINE 5 MG/ML INJ
10.0000 mg | INTRAVENOUS | Status: DC | PRN
  Filled 2015-05-28: qty 2

## 2015-05-28 MED ORDER — OXYCODONE-ACETAMINOPHEN 5-325 MG PO TABS: 2.0000 | ORAL_TABLET | ORAL | Status: DC | PRN

## 2015-05-28 MED ORDER — FENTANYL 2.5 MCG/ML BUPIVACAINE 1/10 % EPIDURAL INFUSION (WH - ANES)
14.0000 mL/h | INTRAMUSCULAR | Status: DC | PRN
  Administered 2015-05-28 – 2015-05-29 (×2): 14 mL/h via EPIDURAL
  Filled 2015-05-28 (×2): qty 125

## 2015-05-28 MED ORDER — FLEET ENEMA 7-19 GM/118ML RE ENEM: 1.0000 | ENEMA | RECTAL | Status: DC | PRN

## 2015-05-28 NOTE — MAU Note (Signed)
Contractions since 1300 and more intense for several hours.

## 2015-05-28 NOTE — Anesthesia Procedure Notes (Signed)
Epidural Patient location during procedure: OB  Staffing Anesthesiologist: Addalynn Kumari Performed by: anesthesiologist   Preanesthetic Checklist Completed: patient identified, site marked, surgical consent, pre-op evaluation, timeout performed, IV checked, risks and benefits discussed and monitors and equipment checked  Epidural Patient position: sitting Prep: site prepped and draped and DuraPrep Patient monitoring: continuous pulse ox and blood pressure Approach: midline Location: L2-L3 Injection technique: LOR air  Needle:  Needle type: Tuohy  Needle gauge: 17 G Needle length: 9 cm and 9 Needle insertion depth: 5 cm and 3.5 cm Catheter type: closed end flexible Catheter size: 19 Gauge Catheter at skin depth: 10 and 7.5 cm Test dose: negative  Assessment Events: blood not aspirated, injection not painful, no injection resistance, negative IV test and no paresthesia  Additional Notes Patient identified. Risks/Benefits/Options discussed with patient including but not limited to bleeding, infection, nerve damage, paralysis, failed block, incomplete pain control, headache, blood pressure changes, nausea, vomiting, reactions to medication both or allergic, itching and postpartum back pain. Confirmed with bedside nurse the patient's most recent platelet count. Confirmed with patient that they are not currently taking any anticoagulation, have any bleeding history or any family history of bleeding disorders. Patient expressed understanding and wished to proceed. All questions were answered. Sterile technique was used throughout the entire procedure. Please see nursing notes for vital signs. Test dose was given through epidural catheter and negative prior to continuing to dose epidural or start infusion. Warning signs of high block given to the patient including shortness of breath, tingling/numbness in hands, complete motor block, or any concerning symptoms with instructions to call for  help. Patient was given instructions on fall risk and not to get out of bed. All questions and concerns addressed with instructions to call with any issues or inadequate analgesia.

## 2015-05-28 NOTE — Anesthesia Preprocedure Evaluation (Addendum)
Anesthesia Evaluation  Patient identified by MRN, date of birth, ID band Patient awake    Reviewed: Allergy & Precautions, H&P , NPO status , Patient's Chart, lab work & pertinent test results  Airway Mallampati: I  TM Distance: >3 FB Neck ROM: full    Dental no notable dental hx.    Pulmonary neg pulmonary ROS,  breath sounds clear to auscultation  Pulmonary exam normal       Cardiovascular negative cardio ROS Normal cardiovascular examRhythm:regular Rate:Normal     Neuro/Psych negative neurological ROS  negative psych ROS   GI/Hepatic negative GI ROS, Neg liver ROS,   Endo/Other  negative endocrine ROS  Renal/GU negative Renal ROS  negative genitourinary   Musculoskeletal   Abdominal   Peds  Hematology negative hematology ROS (+)   Anesthesia Other Findings Pregnancy - uncomplicated Platelets and allergies reviewed Denies active cardiac or pulmonary symptoms, METS > 4  Denies blood thinning medications, bleeding disorders, hypertension, asthma, supine hypotension syndrome, previous anesthesia difficulties    Reproductive/Obstetrics (+) Pregnancy                            Anesthesia Physical Anesthesia Plan  ASA: II  Anesthesia Plan: Epidural   Post-op Pain Management:    Induction:   Airway Management Planned:   Additional Equipment:   Intra-op Plan:   Post-operative Plan:   Informed Consent: I have reviewed the patients History and Physical, chart, labs and discussed the procedure including the risks, benefits and alternatives for the proposed anesthesia with the patient or authorized representative who has indicated his/her understanding and acceptance.     Plan Discussed with:   Anesthesia Plan Comments:        Anesthesia Quick Evaluation

## 2015-05-29 ENCOUNTER — Encounter (HOSPITAL_COMMUNITY): Payer: Self-pay | Admitting: *Deleted

## 2015-05-29 LAB — RPR: RPR Ser Ql: NONREACTIVE

## 2015-05-29 MED ORDER — DIBUCAINE 1 % RE OINT: 1.0000 "application " | TOPICAL_OINTMENT | RECTAL | Status: DC | PRN

## 2015-05-29 MED ORDER — ONDANSETRON HCL 4 MG PO TABS: 4.0000 mg | ORAL_TABLET | ORAL | Status: DC | PRN

## 2015-05-29 MED ORDER — IBUPROFEN 600 MG PO TABS
600.0000 mg | ORAL_TABLET | Freq: Four times a day (QID) | ORAL | Status: DC
  Administered 2015-05-29 – 2015-05-30 (×5): 600 mg via ORAL
  Filled 2015-05-29 (×5): qty 1

## 2015-05-29 MED ORDER — BENZOCAINE-MENTHOL 20-0.5 % EX AERO: 1.0000 "application " | INHALATION_SPRAY | CUTANEOUS | Status: DC | PRN

## 2015-05-29 MED ORDER — FENTANYL CITRATE (PF) 100 MCG/2ML IJ SOLN
INTRAMUSCULAR | Status: AC
  Filled 2015-05-29: qty 2

## 2015-05-29 MED ORDER — PRENATAL MULTIVITAMIN CH
1.0000 | ORAL_TABLET | Freq: Every day | ORAL | Status: DC
Start: 2015-05-29 — End: 2015-05-30
  Administered 2015-05-30: 1 via ORAL
  Filled 2015-05-29 (×2): qty 1

## 2015-05-29 MED ORDER — OXYCODONE-ACETAMINOPHEN 5-325 MG PO TABS
1.0000 | ORAL_TABLET | ORAL | Status: DC | PRN
  Administered 2015-05-29 – 2015-05-30 (×4): 1 via ORAL
  Filled 2015-05-29 (×4): qty 1

## 2015-05-29 MED ORDER — BUPIVACAINE HCL (PF) 0.25 % IJ SOLN
INTRAMUSCULAR | Status: DC | PRN
  Administered 2015-05-29 (×2): 3 mL via PERINEURAL

## 2015-05-29 MED ORDER — SENNOSIDES-DOCUSATE SODIUM 8.6-50 MG PO TABS
2.0000 | ORAL_TABLET | ORAL | Status: DC
  Administered 2015-05-29: 2 via ORAL
  Filled 2015-05-29: qty 2

## 2015-05-29 MED ORDER — WITCH HAZEL-GLYCERIN EX PADS: 1.0000 "application " | MEDICATED_PAD | CUTANEOUS | Status: DC | PRN

## 2015-05-29 MED ORDER — ACETAMINOPHEN 325 MG PO TABS: 650.0000 mg | ORAL_TABLET | ORAL | Status: DC | PRN

## 2015-05-29 MED ORDER — TETANUS-DIPHTH-ACELL PERTUSSIS 5-2.5-18.5 LF-MCG/0.5 IM SUSP: 0.5000 mL | Freq: Once | INTRAMUSCULAR | Status: DC

## 2015-05-29 MED ORDER — FENTANYL CITRATE (PF) 100 MCG/2ML IJ SOLN
INTRAMUSCULAR | Status: DC | PRN
  Administered 2015-05-29 (×2): 50 ug via EPIDURAL

## 2015-05-29 MED ORDER — DIPHENHYDRAMINE HCL 25 MG PO CAPS: 25.0000 mg | ORAL_CAPSULE | Freq: Four times a day (QID) | ORAL | Status: DC | PRN

## 2015-05-29 MED ORDER — MEASLES, MUMPS & RUBELLA VAC ~~LOC~~ INJ
0.5000 mL | INJECTION | Freq: Once | SUBCUTANEOUS | Status: DC
  Filled 2015-05-29: qty 0.5

## 2015-05-29 MED ORDER — OXYCODONE-ACETAMINOPHEN 5-325 MG PO TABS: 2.0000 | ORAL_TABLET | ORAL | Status: DC | PRN

## 2015-05-29 MED ORDER — ZOLPIDEM TARTRATE 5 MG PO TABS: 5.0000 mg | ORAL_TABLET | Freq: Every evening | ORAL | Status: DC | PRN

## 2015-05-29 MED ORDER — ONDANSETRON HCL 4 MG/2ML IJ SOLN: 4.0000 mg | INTRAMUSCULAR | Status: DC | PRN

## 2015-05-29 MED ORDER — SIMETHICONE 80 MG PO CHEW
80.0000 mg | CHEWABLE_TABLET | ORAL | Status: DC | PRN
Start: 2015-05-29 — End: 2015-05-30

## 2015-05-29 MED ORDER — LANOLIN HYDROUS EX OINT
TOPICAL_OINTMENT | CUTANEOUS | Status: DC | PRN
Start: 2015-05-29 — End: 2015-05-30

## 2015-05-29 MED ORDER — MEDROXYPROGESTERONE ACETATE 150 MG/ML IM SUSP: 150.0000 mg | INTRAMUSCULAR | Status: DC | PRN

## 2015-05-29 NOTE — H&P (Signed)
Brenda Bryant is a 34 y.o. female presenting for labor sxs.  Admitted at 4 cm.  Pregnancy complicated by AMA with Normal NIPT.  GBS+. History OB History    Gravida Para Term Preterm AB TAB SAB Ectopic Multiple Living   8 2 2  5  5   2      History reviewed. No pertinent past medical history. Past Surgical History  Procedure Laterality Date  . No past surgeries     Family History: family history is not on file. Social History:  reports that she has never smoked. She has never used smokeless tobacco. She reports that she does not drink alcohol or use illicit drugs.   Prenatal Transfer Tool  Maternal Diabetes: No Genetic Screening: Normal Maternal Ultrasounds/Referrals: Normal Fetal Ultrasounds or other Referrals:  None Maternal Substance Abuse:  No Significant Maternal Medications:  None Significant Maternal Lab Results:  None Other Comments:  None  ROS  Dilation: 9 Effacement (%): 100 Station: -1 Exam by:: U.[email or web address],KT Blood pressure 101/52, pulse 74, temperature 97.6 F (36.4 C), temperature source Oral, resp. rate 16, height 5\' 5"  (1.651 m), weight 161 lb 3.2 oz (73.12 kg), SpO2 100 %, not currently breastfeeding. Exam Physical Exam  Prenatal labs: ABO, Rh: --/--/A NEG (07/22 2210) Antibody: POS (07/22 2210) Rubella: Nonimmune (07/03 0000) RPR: Nonreactive (01/13 0000)  HBsAg: Negative (07/03 0000)  HIV: Non-reactive (01/13 0000)  GBS: Positive (01/13 0000)   Assessment/Plan: IUP at term in active labor IV Abx For GBS Anticipate SVD   Brenda Bryant C 05/29/2015, 7:35 AM

## 2015-05-29 NOTE — Plan of Care (Signed)
Problem: Discharge Progression Outcomes Goal: MMR given as ordered Outcome: Not Met (add Reason) Patient declined

## 2015-05-30 ENCOUNTER — Ambulatory Visit: Payer: Self-pay

## 2015-05-30 LAB — TYPE AND SCREEN
ABO/RH(D): A NEG
ANTIBODY SCREEN: POSITIVE
DAT, IgG: NEGATIVE
UNIT DIVISION: 0
Unit division: 0

## 2015-05-30 LAB — CBC
HEMATOCRIT: 33.2 % — AB (ref 36.0–46.0)
Hemoglobin: 11.6 g/dL — ABNORMAL LOW (ref 12.0–15.0)
MCH: 35 pg — ABNORMAL HIGH (ref 26.0–34.0)
MCHC: 34.9 g/dL (ref 30.0–36.0)
MCV: 100.3 fL — AB (ref 78.0–100.0)
Platelets: 139 10*3/uL — ABNORMAL LOW (ref 150–400)
RBC: 3.31 MIL/uL — ABNORMAL LOW (ref 3.87–5.11)
RDW: 13.3 % (ref 11.5–15.5)
WBC: 14.4 10*3/uL — ABNORMAL HIGH (ref 4.0–10.5)

## 2015-05-30 MED ORDER — OXYCODONE-ACETAMINOPHEN 5-325 MG PO TABS: 1.0000 | ORAL_TABLET | ORAL | Status: DC | PRN

## 2015-05-30 MED ORDER — IBUPROFEN 600 MG PO TABS: 600.0000 mg | ORAL_TABLET | Freq: Four times a day (QID) | ORAL | Status: DC

## 2015-05-30 MED ORDER — RHO D IMMUNE GLOBULIN 1500 UNIT/2ML IJ SOSY
300.0000 ug | PREFILLED_SYRINGE | Freq: Once | INTRAMUSCULAR | Status: AC
  Administered 2015-05-30: 300 ug via INTRAVENOUS
  Filled 2015-05-30: qty 2

## 2015-05-30 NOTE — Anesthesia Postprocedure Evaluation (Signed)
  Anesthesia Post-op Note  Patient: Brenda Bryant  Procedure(s) Performed: * No procedures listed *  Patient Location: Mother/Baby  Anesthesia Type:Epidural  Level of Consciousness: awake, alert , oriented and patient cooperative  Airway and Oxygen Therapy: Patient Spontanous Breathing  Post-op Pain: none  Post-op Assessment: Post-op Vital signs reviewed, Patient's Cardiovascular Status Stable, Respiratory Function Stable, Patent Airway, No headache, No backache and Patient able to bend at knees              Post-op Vital Signs: Reviewed and stable  Last Vitals:  Filed Vitals:   05/30/15 0500  BP: 97/54  Pulse: 66  Temp: 36.7 C  Resp: 18    Complications: No apparent anesthesia complications

## 2015-05-30 NOTE — Lactation Note (Signed)
This note was copied from the chart of [facility]. Lactation Consultation Note; initial visit with Lactation services. Mother was given brochure . Mother states that she is experienced with 2 other children for 4 and 6 months. Mother on the phone and denies having any breastfeeding concerns.   Patient Name: Brenda Bryant Today's Date: 05/30/2015     Maternal Data    Feeding    LATCH Score/Interventions                      Lactation Tools Discussed/Used     Consult Status      [name] 05/30/2015, 4:56 PM

## 2015-05-30 NOTE — Discharge Summary (Signed)
Obstetric Discharge Summary Reason for Admission: onset of labor Prenatal Procedures: none Intrapartum Procedures: spontaneous vaginal delivery Postpartum Procedures: none Complications-Operative and Postpartum: none HEMOGLOBIN  Date Value Ref Range Status  05/30/2015 11.6* 12.0 - 15.0 g/dL Final  05/08/2013 14.2 g/dL Final   HCT  Date Value Ref Range Status  05/30/2015 33.2* 36.0 - 46.0 % Final  05/08/2013 39 % Final    Physical Exam:  General: alert, cooperative, appears stated age and no distress Lochia: appropriate Uterine Fundus: firm Incision: healing well DVT Evaluation: No evidence of DVT seen on physical exam.  Discharge Diagnoses: Term Pregnancy-delivered  Discharge Information: Date: 05/30/2015 Activity: pelvic rest Diet: routine Medications: Ibuprofen and Percocet Condition: stable Instructions: refer to practice specific booklet Discharge to: home   Newborn Data: Live born female  Birth Weight: 6 lb 10.2 oz (3010 g) APGAR: 9, 9  Home with mother after circumcision.  Emon Miggins C 05/30/2015, 9:30 AM

## 2015-05-31 LAB — RH IG WORKUP (INCLUDES ABO/RH)
ABO/RH(D): A NEG
FETAL SCREEN: NEGATIVE
Gestational Age(Wks): 38.2
UNIT DIVISION: 0

## 2015-10-07 ENCOUNTER — Encounter (HOSPITAL_COMMUNITY): Payer: 59

## 2015-10-07 ENCOUNTER — Ambulatory Visit (HOSPITAL_COMMUNITY)
Admission: RE | Admit: 2015-10-07 | Discharge: 2015-10-07 | Disposition: A | Discharge status: Home / Self Care | Payer: 59 | Source: Ambulatory Visit | Attending: Obstetrics and Gynecology | Admitting: Obstetrics and Gynecology

## 2015-10-07 ENCOUNTER — Other Ambulatory Visit (HOSPITAL_COMMUNITY): Payer: Self-pay | Admitting: Obstetrics and Gynecology

## 2015-10-07 DIAGNOSIS — R609 Edema, unspecified: Secondary | ICD-10-CM

## 2015-10-07 DIAGNOSIS — M7989 Other specified soft tissue disorders: Secondary | ICD-10-CM | POA: Diagnosis not present

## 2015-10-07 NOTE — Progress Notes (Signed)
VASCULAR LAB PRELIMINARY  PRELIMINARY  PRELIMINARY  PRELIMINARY  Left lower extremity venous duplex completed.    Preliminary report:  There is no DVT or SVT noted in the left lower extremity.   Douglas Rooks, RVT 10/07/2015, 3:46 PM

## 2017-01-20 ENCOUNTER — Emergency Department (HOSPITAL_BASED_OUTPATIENT_CLINIC_OR_DEPARTMENT_OTHER)
Admission: RE | Admit: 2017-01-20 | Discharge: 2017-01-20 | Disposition: A | Discharge status: Home / Self Care | Payer: 59 | Source: Ambulatory Visit | Attending: Emergency Medicine | Admitting: Emergency Medicine

## 2017-01-20 ENCOUNTER — Encounter (HOSPITAL_COMMUNITY): Payer: Self-pay | Admitting: Emergency Medicine

## 2017-01-20 ENCOUNTER — Emergency Department (HOSPITAL_COMMUNITY): Payer: 59

## 2017-01-20 ENCOUNTER — Emergency Department (HOSPITAL_COMMUNITY)
Admission: EM | Admit: 2017-01-20 | Discharge: 2017-01-20 | Disposition: A | Discharge status: Home / Self Care | Payer: 59 | Attending: Emergency Medicine | Admitting: Emergency Medicine

## 2017-01-20 DIAGNOSIS — R079 Chest pain, unspecified: Secondary | ICD-10-CM

## 2017-01-20 DIAGNOSIS — M79652 Pain in left thigh: Secondary | ICD-10-CM | POA: Diagnosis not present

## 2017-01-20 DIAGNOSIS — M79609 Pain in unspecified limb: Secondary | ICD-10-CM

## 2017-01-20 DIAGNOSIS — E039 Hypothyroidism, unspecified: Secondary | ICD-10-CM | POA: Insufficient documentation

## 2017-01-20 DIAGNOSIS — Z79899 Other long term (current) drug therapy: Secondary | ICD-10-CM | POA: Diagnosis not present

## 2017-01-20 DIAGNOSIS — R0789 Other chest pain: Secondary | ICD-10-CM | POA: Insufficient documentation

## 2017-01-20 HISTORY — DX: Hypothyroidism, unspecified: E03.9

## 2017-01-20 HISTORY — DX: Lyme disease, unspecified: A69.20

## 2017-01-20 LAB — CBC WITH DIFFERENTIAL/PLATELET
BASOS PCT: 1 %
Basophils Absolute: 0.1 10*3/uL (ref 0.0–0.1)
EOS PCT: 4 %
Eosinophils Absolute: 0.4 10*3/uL (ref 0.0–0.7)
HEMATOCRIT: 38.6 % (ref 36.0–46.0)
Hemoglobin: 13.9 g/dL (ref 12.0–15.0)
Lymphocytes Relative: 21 %
Lymphs Abs: 2 10*3/uL (ref 0.7–4.0)
MCH: 32.9 pg (ref 26.0–34.0)
MCHC: 36 g/dL (ref 30.0–36.0)
MCV: 91.3 fL (ref 78.0–100.0)
MONO ABS: 0.5 10*3/uL (ref 0.1–1.0)
MONOS PCT: 5 %
NEUTROS ABS: 6.5 10*3/uL (ref 1.7–7.7)
Neutrophils Relative %: 69 %
Platelets: 239 10*3/uL (ref 150–400)
RBC: 4.23 MIL/uL (ref 3.87–5.11)
RDW: 12.5 % (ref 11.5–15.5)
WBC: 9.4 10*3/uL (ref 4.0–10.5)

## 2017-01-20 LAB — BASIC METABOLIC PANEL
ANION GAP: 9 (ref 5–15)
BUN: 9 mg/dL (ref 6–20)
CALCIUM: 8.9 mg/dL (ref 8.9–10.3)
CO2: 24 mmol/L (ref 22–32)
CREATININE: 0.71 mg/dL (ref 0.44–1.00)
Chloride: 106 mmol/L (ref 101–111)
GFR calc Af Amer: >60 mL/min (ref >60)
Glucose, Bld: 88 mg/dL (ref 65–99)
Potassium: 3.8 mmol/L (ref 3.5–5.1)
Sodium: 139 mmol/L (ref 135–145)

## 2017-01-20 LAB — TROPONIN I: Troponin I: <0.03 ng/mL (ref <0.03)

## 2017-01-20 LAB — D-DIMER, QUANTITATIVE (NOT AT ARMC)

## 2017-01-20 MED ORDER — DIPHENOXYLATE-ATROPINE 2.5-0.025 MG PO TABS
1.0000 | ORAL_TABLET | Freq: Once | ORAL | Status: AC
  Administered 2017-01-20: 1 via ORAL
  Filled 2017-01-20: qty 1

## 2017-01-20 MED ORDER — IOPAMIDOL (ISOVUE-370) INJECTION 76%
INTRAVENOUS | Status: AC
  Administered 2017-01-20: 100 mL
  Filled 2017-01-20: qty 100

## 2017-01-20 MED ORDER — MAGNESIUM HYDROXIDE 400 MG/5ML PO SUSP
30.0000 mL | Freq: Once | ORAL | Status: DC
  Filled 2017-01-20: qty 30

## 2017-01-20 NOTE — Progress Notes (Signed)
VASCULAR LAB PRELIMINARY  PRELIMINARY  PRELIMINARY  PRELIMINARY  Left lower extremity venous duplex completed.    Preliminary report:  There is no DVT noted in the left lower extremity.  There is superficial thrombosis noted in the greater saphenous vein that is expected, since vein was injected.  Called report to Dr. Darvin Neighbours, [facility], RVT 01/20/2017, 1:06 PM

## 2017-01-20 NOTE — ED Triage Notes (Signed)
Pt reports vein procedure yesterday to left leg. Pt reports left sided chest discomfort when taking a deep breath that has been constant since then. Pt took a xanax to rule out anxiety but still has this pain with deep breaths.

## 2017-01-20 NOTE — ED Notes (Signed)
Awaiting [place] results, pt updated

## 2017-01-20 NOTE — Discharge Instructions (Signed)
Test showed no life-threatening condition. Tylenol and/or ibuprofen for pain. Follow-up your primary care doctor.

## 2017-01-20 NOTE — ED Notes (Signed)
Placed patient into a gown and on the monitor did ekg shown to dr [employer]

## 2017-01-20 NOTE — ED Notes (Signed)
Pt given a snack 

## 2017-01-20 NOTE — ED Provider Notes (Signed)
[facility] DEPT Provider Note   CSN: [id number] Arrival date & time: 01/20/17  1009     History   Chief Complaint Chief Complaint  Patient presents with  . Chest Pain    HPI Brenda Bryant is a 36 y.o. female.  Left-sided chest pain worsened with deep breath since yesterday. Patient is status post a vein procedure on her left lower extremity by the vein and vascular physicians yesterday.  No crushing substernal chest pain, dyspnea, diaphoresis. No history of cardiac problems. Patient took a Xanax but this did not help much. Severity of symptoms is mild.      Past Medical History:  Diagnosis Date  . Hypothyroidism   . Lyme disease     Patient Active Problem List   Diagnosis Date Noted  . Active labor at term 05/28/2015  . Active labor 12/10/2013  . SVD (spontaneous vaginal delivery) 12/10/2013    Past Surgical History:  Procedure Laterality Date  . NO PAST SURGERIES      OB History    Gravida Para Term Preterm AB Living   8 3 3   5 2    SAB TAB Ectopic Multiple Live Births   5     0 3       Home Medications    Prior to Admission medications   Medication Sig Start Date End Date Taking? Authorizing Provider  calcium carbonate (TUMS - DOSED IN MG ELEMENTAL CALCIUM) 500 MG chewable tablet Chew 1-2 tablets by mouth daily as needed for indigestion or heartburn.    Historical Provider, MD  ibuprofen (ADVIL,MOTRIN) 600 MG tablet Take 1 tablet (600 mg total) by mouth every 6 (six) hours. 05/30/15   Louretta Shorten, MD  oxyCODONE-acetaminophen (PERCOCET/ROXICET) 5-325 MG per tablet Take 1 tablet by mouth every 4 (four) hours as needed (for pain scale 4-7). 05/30/15   Louretta Shorten, MD  Prenatal Vit-Fe Fumarate-FA (PRENATAL MULTIVITAMIN) TABS tablet Take 1 tablet by mouth daily at 12 noon. 12/12/13   Juanda Chance, NP  Probiotic Product (PROBIOTIC PO) Take 1 capsule by mouth daily.    Historical Provider, MD    Family History No family history on file.  Social  History Social History  Substance Use Topics  . Smoking status: Never Smoker  . Smokeless tobacco: Never Used  . Alcohol use No     Allergies   Patient has no known allergies.   Review of Systems Review of Systems  All other systems reviewed and are negative.    Physical Exam Updated Vital Signs BP 97/63   Pulse 75   Temp 97.7 F (36.5 C) (Oral)   Resp 14   Ht 5\' 5"  (1.651 m)   Wt 115 lb (52.2 kg)   LMP 01/13/2017   SpO2 99%   BMI 19.14 kg/m   Physical Exam  Constitutional: She is oriented to person, place, and time. She appears well-developed and well-nourished.  HENT:  Head: Normocephalic and atraumatic.  Eyes: Conjunctivae are normal.  Neck: Neck supple.  Cardiovascular: Normal rate and regular rhythm.   Pulmonary/Chest: Effort normal and breath sounds normal.  Abdominal: Soft. Bowel sounds are normal.  Musculoskeletal:  Minimal tenderness to left posterior thigh  Neurological: She is alert and oriented to person, place, and time.  Skin: Skin is warm and dry.  Psychiatric: She has a normal mood and affect. Her behavior is normal.  Nursing note and vitals reviewed.    ED Treatments / Results  Labs (all labs ordered are listed, but only  abnormal results are displayed) Labs Reviewed  CBC WITH DIFFERENTIAL/PLATELET  BASIC METABOLIC PANEL  TROPONIN I  D-DIMER, QUANTITATIVE (NOT AT [facility])    EKG  EKG Interpretation  Date/Time:  Saturday January 20 2017 10:18:24 EDT Ventricular Rate:  75 PR Interval:    QRS Duration: 81 QT Interval:  391 QTC Calculation: 437 R Axis:   79 Text Interpretation:  Sinus rhythm RSR' in V1 or V2, probably normal variant Confirmed by Lacinda Axon  MD, Reegan Bouffard ([id number]) on 01/20/2017 1:29:40 PM       Radiology Dg Chest 2 View  Result Date: 01/20/2017 CLINICAL DATA:  Left chest pain today with deep inspiration. EXAM: CHEST  2 VIEW COMPARISON:  None. FINDINGS: The heart size and mediastinal contours are within normal limits. Both lungs  are clear. The visualized skeletal structures are unremarkable. IMPRESSION: Normal examination. Electronically Signed   By: Claudie Revering M.D.   On: 01/20/2017 11:29   Ct Angio Chest Pe W/cm &/or Wo Cm  Result Date: 01/20/2017 CLINICAL DATA:  Patient head surgery involving a vein in her leg yesterday. Chest pain today. EXAM: CT ANGIOGRAPHY CHEST WITH CONTRAST TECHNIQUE: Multidetector CT imaging of the chest was performed using the standard protocol during bolus administration of intravenous contrast. Multiplanar CT image reconstructions and MIPs were obtained to evaluate the vascular anatomy. CONTRAST:  100 mL of Isovue 370 COMPARISON:  None. FINDINGS: Cardiovascular: The thoracic aorta is normal in appearance. No effusions. Heart size is normal. No pulmonary emboli. Mediastinum/Nodes: No enlarged mediastinal, hilar, or axillary lymph nodes. Thyroid gland, trachea, and esophagus demonstrate no significant findings. Lungs/Pleura: The central airways are normal. No pneumothorax. No pulmonary nodules, masses, or focal infiltrates. Upper Abdomen: No acute abnormality. Musculoskeletal: No chest wall abnormality. No acute or significant osseous findings. Review of the MIP images confirms the above findings. IMPRESSION: 1. No pulmonary emboli identified. No cause for chest pain identified. Electronically Signed   By: Dorise Bullion III M.D   On: 01/20/2017 12:08    Procedures Procedures (including critical care time)  Medications Ordered in ED Medications  iopamidol (ISOVUE-370) 76 % injection (100 mLs  Contrast Given 01/20/17 1128)  diphenoxylate-atropine (LOMOTIL) 2.5-0.025 MG per tablet 1 tablet (1 tablet Oral Given 01/20/17 1221)     Initial Impression / Assessment and Plan / ED Course  I have reviewed the triage vital signs and the nursing notes.  Pertinent labs & imaging results that were available during my care of the patient were reviewed by me and considered in my medical decision making (see  chart for details).     Patient is hemodynamically stable. Because of her recent vascular procedure, I ordered a Doppler study of her left lower extremity and a CT angiogram of her chest. These studies revealed no acute abnormalities. EKG and troponin negative. Discussed with patient.  Final Clinical Impressions(s) / ED Diagnoses   Final diagnoses:  Chest pain, unspecified type    New Prescriptions Discharge Medication List as of 01/20/2017  3:20 PM       Nat Christen, MD 01/20/17 1542

## 2018-12-16 ENCOUNTER — Encounter: Payer: Self-pay | Admitting: Gastroenterology

## 2019-01-08 ENCOUNTER — Ambulatory Visit: Payer: 59 | Admitting: Gastroenterology

## 2019-01-21 ENCOUNTER — Telehealth: Payer: Self-pay

## 2019-01-21 NOTE — Telephone Encounter (Signed)
Covid-19 travel screening questions  Have you traveled in the last 14 days? If yes where?  Do you now or have you had a fever in the last 14 days?  Do you have any respiratory symptoms of shortness of breath or cough now or in the last 14 days?  Do you have a medical history of Congestive Heart Failure?  Do you have a medical history of lung disease?  Do you have any family members or close contacts with diagnosed or suspected Covid-19?  No answer message left for the pt to call and reschedule if they answer yes to any quesitons

## 2019-01-22 ENCOUNTER — Ambulatory Visit: Payer: 59 | Admitting: Gastroenterology

## 2019-06-03 ENCOUNTER — Other Ambulatory Visit: Payer: Self-pay | Admitting: Otolaryngology

## 2019-06-03 DIAGNOSIS — R1314 Dysphagia, pharyngoesophageal phase: Secondary | ICD-10-CM

## 2019-06-16 ENCOUNTER — Other Ambulatory Visit: Payer: 59

## 2019-06-20 ENCOUNTER — Other Ambulatory Visit: Payer: 59

## 2019-06-25 ENCOUNTER — Ambulatory Visit
Admission: RE | Admit: 2019-06-25 | Discharge: 2019-06-25 | Disposition: A | Discharge status: Home / Self Care | Payer: 59 | Source: Ambulatory Visit | Attending: Otolaryngology | Admitting: Otolaryngology

## 2019-06-25 DIAGNOSIS — R1314 Dysphagia, pharyngoesophageal phase: Secondary | ICD-10-CM

## 2019-06-25 MED ORDER — IOPAMIDOL (ISOVUE-300) INJECTION 61%
75.0000 mL | Freq: Once | INTRAVENOUS | Status: AC | PRN
  Administered 2019-06-25: 75 mL via INTRAVENOUS

## 2021-12-18 ENCOUNTER — Encounter (HOSPITAL_COMMUNITY): Payer: Self-pay

## 2021-12-18 ENCOUNTER — Emergency Department (HOSPITAL_COMMUNITY): Payer: No Typology Code available for payment source

## 2021-12-18 ENCOUNTER — Emergency Department (HOSPITAL_COMMUNITY)
Admission: EM | Admit: 2021-12-18 | Discharge: 2021-12-18 | Disposition: A | Discharge status: Home / Self Care | Payer: No Typology Code available for payment source | Attending: Emergency Medicine | Admitting: Emergency Medicine

## 2021-12-18 DIAGNOSIS — R42 Dizziness and giddiness: Secondary | ICD-10-CM | POA: Insufficient documentation

## 2021-12-18 DIAGNOSIS — Z20822 Contact with and (suspected) exposure to covid-19: Secondary | ICD-10-CM | POA: Diagnosis not present

## 2021-12-18 DIAGNOSIS — H05821 Myopathy of extraocular muscles, right orbit: Secondary | ICD-10-CM | POA: Diagnosis not present

## 2021-12-18 DIAGNOSIS — H5711 Ocular pain, right eye: Secondary | ICD-10-CM | POA: Diagnosis present

## 2021-12-18 DIAGNOSIS — H5319 Other subjective visual disturbances: Secondary | ICD-10-CM | POA: Insufficient documentation

## 2021-12-18 LAB — CBC WITH DIFFERENTIAL/PLATELET
Abs Immature Granulocytes: 0.06 10*3/uL (ref 0.00–0.07)
Basophils Absolute: 0.1 10*3/uL (ref 0.0–0.1)
Basophils Relative: 0 %
Eosinophils Absolute: 0.1 10*3/uL (ref 0.0–0.5)
Eosinophils Relative: 1 %
HCT: 39.6 % (ref 36.0–46.0)
Hemoglobin: 13.6 g/dL (ref 12.0–15.0)
Immature Granulocytes: 0 %
Lymphocytes Relative: 10 %
Lymphs Abs: 1.5 10*3/uL (ref 0.7–4.0)
MCH: 32.9 pg (ref 26.0–34.0)
MCHC: 34.3 g/dL (ref 30.0–36.0)
MCV: 95.9 fL (ref 80.0–100.0)
Monocytes Absolute: 0.6 10*3/uL (ref 0.1–1.0)
Monocytes Relative: 4 %
Neutro Abs: 12.4 10*3/uL — ABNORMAL HIGH (ref 1.7–7.7)
Neutrophils Relative %: 85 %
Platelets: 248 10*3/uL (ref 150–400)
RBC: 4.13 MIL/uL (ref 3.87–5.11)
RDW: 12.1 % (ref 11.5–15.5)
WBC: 14.6 10*3/uL — ABNORMAL HIGH (ref 4.0–10.5)
nRBC: 0 % (ref 0.0–0.2)

## 2021-12-18 LAB — RESP PANEL BY RT-PCR (FLU A&B, COVID) ARPGX2
Influenza A by PCR: NEGATIVE
Influenza B by PCR: NEGATIVE
SARS Coronavirus 2 by RT PCR: NEGATIVE

## 2021-12-18 LAB — PROTIME-INR
INR: 1 (ref 0.8–1.2)
Prothrombin Time: 13.1 seconds (ref 11.4–15.2)

## 2021-12-18 LAB — BASIC METABOLIC PANEL
Anion gap: 8 (ref 5–15)
BUN: 9 mg/dL (ref 6–20)
CO2: 24 mmol/L (ref 22–32)
Calcium: 9 mg/dL (ref 8.9–10.3)
Chloride: 105 mmol/L (ref 98–111)
Creatinine, Ser: 0.63 mg/dL (ref 0.44–1.00)
GFR, Estimated: >60 mL/min (ref >60)
Glucose, Bld: 118 mg/dL — ABNORMAL HIGH (ref 70–99)
Potassium: 4.1 mmol/L (ref 3.5–5.1)
Sodium: 137 mmol/L (ref 135–145)

## 2021-12-18 LAB — I-STAT BETA HCG BLOOD, ED (MC, WL, AP ONLY): I-stat hCG, quantitative: <5 m[IU]/mL (ref <5)

## 2021-12-18 LAB — C-REACTIVE PROTEIN: CRP: 0.5 mg/dL (ref <1.0)

## 2021-12-18 MED ORDER — TETRACAINE HCL 0.5 % OP SOLN
2.0000 [drp] | Freq: Once | OPHTHALMIC | Status: AC
  Administered 2021-12-18: 2 [drp] via OPHTHALMIC
  Filled 2021-12-18: qty 4

## 2021-12-18 MED ORDER — SODIUM CHLORIDE 0.9 % IV SOLN
2.0000 g | Freq: Once | INTRAVENOUS | Status: AC
  Administered 2021-12-18: 2 g via INTRAVENOUS
  Filled 2021-12-18: qty 20

## 2021-12-18 MED ORDER — IOHEXOL 300 MG/ML  SOLN
75.0000 mL | Freq: Once | INTRAMUSCULAR | Status: AC | PRN
  Administered 2021-12-18: 75 mL via INTRAVENOUS

## 2021-12-18 MED ORDER — FENTANYL CITRATE PF 50 MCG/ML IJ SOSY
PREFILLED_SYRINGE | INTRAMUSCULAR | Status: AC
  Filled 2021-12-18: qty 1

## 2021-12-18 MED ORDER — HYDROMORPHONE HCL 1 MG/ML IJ SOLN
0.5000 mg | Freq: Once | INTRAMUSCULAR | Status: AC
  Administered 2021-12-18: 0.5 mg via INTRAVENOUS
  Filled 2021-12-18: qty 1

## 2021-12-18 MED ORDER — OXYCODONE-ACETAMINOPHEN 5-325 MG PO TABS: 1.0000 | ORAL_TABLET | Freq: Four times a day (QID) | ORAL | 0 refills | Status: DC | PRN

## 2021-12-18 MED ORDER — CEPHALEXIN 500 MG PO CAPS: 500.0000 mg | ORAL_CAPSULE | Freq: Four times a day (QID) | ORAL | 0 refills | Status: DC

## 2021-12-18 MED ORDER — FENTANYL CITRATE PF 50 MCG/ML IJ SOSY
50.0000 ug | PREFILLED_SYRINGE | Freq: Once | INTRAMUSCULAR | Status: AC
  Administered 2021-12-18: 50 ug via INTRAVENOUS

## 2021-12-18 MED ORDER — METHYLPREDNISOLONE SODIUM SUCC 125 MG IJ SOLR
125.0000 mg | Freq: Once | INTRAMUSCULAR | Status: AC
  Administered 2021-12-18: 125 mg via INTRAVENOUS
  Filled 2021-12-18: qty 2

## 2021-12-18 MED ORDER — PREDNISONE 10 MG PO TABS: 20.0000 mg | ORAL_TABLET | Freq: Every day | ORAL | 0 refills | Status: DC

## 2021-12-18 MED ORDER — FLUORESCEIN SODIUM 1 MG OP STRP
1.0000 | ORAL_STRIP | Freq: Once | OPHTHALMIC | Status: AC
  Administered 2021-12-18: 1 via OPHTHALMIC
  Filled 2021-12-18: qty 1

## 2021-12-18 MED ORDER — CEFTRIAXONE SODIUM 1 G IJ SOLR: 2.0000 g | Freq: Once | INTRAMUSCULAR | 0 refills | Status: DC

## 2021-12-18 NOTE — Discharge Instructions (Signed)
You have inflammation of the muscles that move your eyes. You are given anti-inflammatory medication, pain medication, antibiotics here in the emergency department Please call Dr. Richard Miu office tomorrow morning if you do not hear from her for follow-up. Take pain medication as needed Please take prednisone as prescribed Return if you are having worsening symptoms or new symptoms.

## 2021-12-18 NOTE — ED Provider Notes (Signed)
[facility] EMERGENCY DEPARTMENT Provider Note   CSN: [id number] Arrival date & time: 12/18/21  1631     History  Chief Complaint  Patient presents with   Dizziness    Brenda Bryant is a 41 y.o. female.  HPI 41 year old female history of hypothyroidism, on thyroid medications presents today complaining of right eye pain.  She states that several weeks ago she did some pain with lateral and medial gaze.  3 days ago she began to have increased pain with movement of the right eye.  At one point she was strep positive was on antibiotics.  She did not note any significant nasal discharge.  No fever, chills, nasal drainage. Patient is not on any blood thinners.     Home Medications Prior to Admission medications   Medication Sig Start Date End Date Taking? Authorizing Provider  cephALEXin (KEFLEX) 500 MG capsule Take 1 capsule (500 mg total) by mouth 4 (four) times daily. 12/18/21  Yes Pattricia Boss, MD  oxyCODONE-acetaminophen (PERCOCET/ROXICET) 5-325 MG tablet Take 1 tablet by mouth every 6 (six) hours as needed for severe pain. 12/18/21  Yes Pattricia Boss, MD  predniSONE (DELTASONE) 10 MG tablet Take 2 tablets (20 mg total) by mouth daily. 12/18/21  Yes Pattricia Boss, MD  calcium carbonate (TUMS - DOSED IN MG ELEMENTAL CALCIUM) 500 MG chewable tablet Chew 1-2 tablets by mouth daily as needed for indigestion or heartburn.    [provider]  ibuprofen (ADVIL,MOTRIN) 600 MG tablet Take 1 tablet (600 mg total) by mouth every 6 (six) hours. 05/30/15   Louretta Shorten, MD  Prenatal Vit-Fe Fumarate-FA (PRENATAL MULTIVITAMIN) TABS tablet Take 1 tablet by mouth daily at 12 noon. 12/12/13   Juanda Chance, NP  Probiotic Product (PROBIOTIC PO) Take 1 capsule by mouth daily.    [provider]      Allergies    Patient has no known allergies.    Review of Systems   Review of Systems  All other systems reviewed and are negative.  Physical Exam Updated  Vital Signs BP 115/66    Pulse 65    Temp 97.9 F (36.6 C) (Oral)    Resp 13    Ht 1.626 m (5\' 4" )    Wt 59 kg    SpO2 97%    BMI 22.31 kg/m  Physical Exam Vitals and nursing note reviewed.  HENT:     Head: Normocephalic.     Right Ear: External ear normal.     Left Ear: External ear normal.     Nose: Nose normal.     Mouth/Throat:     Pharynx: Oropharynx is clear.  Eyes:     General: Lids are normal.        Left eye: Left eye hordeolum: 18.    Intraocular pressure: Right eye pressure is 18 mmHg. Left eye pressure is 13 mmHg.     Extraocular Movements: Extraocular movements intact.     Right eye: Abnormal extraocular motion present.     Pupils: Pupils are equal, round, and reactive to light.     Comments: Patient unable to completely abduct right eye Significant pain with right eye abduction  Cardiovascular:     Rate and Rhythm: Normal rate and regular rhythm.     Pulses: Normal pulses.  Pulmonary:     Effort: Pulmonary effort is normal.     Breath sounds: Normal breath sounds.  Abdominal:     General: Bowel sounds are normal.  Palpations: Abdomen is soft.  Musculoskeletal:        General: Normal range of motion.     Cervical back: Normal range of motion.  Skin:    General: Skin is warm.     Capillary Refill: Capillary refill takes less than 2 seconds.  Neurological:     General: No focal deficit present.     Mental Status: She is alert.  Psychiatric:        Mood and Affect: Mood normal.    ED Results / Procedures / Treatments   Labs (all labs ordered are listed, but only abnormal results are displayed) Labs Reviewed  CBC WITH DIFFERENTIAL/PLATELET - Abnormal; Notable for the following components:      Result Value   WBC 14.6 (*)    Neutro Abs 12.4 (*)    All other components within normal limits  BASIC METABOLIC PANEL - Abnormal; Notable for the following components:   Glucose, Bld 118 (*)    All other components within normal limits  SEDIMENTATION RATE -  Abnormal; Notable for the following components:   Sed Rate 35 (*)    All other components within normal limits  RESP PANEL BY RT-PCR (FLU A&B, COVID) ARPGX2  PROTIME-INR  C-REACTIVE PROTEIN  QUANTIFERON-TB GOLD PLUS  RPR  ANGIOTENSIN CONVERTING ENZYME  I-STAT BETA HCG BLOOD, ED ([facility], [facility], [facility] ONLY)    EKG None  Radiology CT Head Wo Contrast  Result Date: 12/18/2021 CLINICAL DATA:  Headache, sudden, severe. Dizziness, headache, right facial pain, blurred vision EXAM: CT HEAD WITHOUT CONTRAST TECHNIQUE: Contiguous axial images were obtained from the base of the skull through the vertex without intravenous contrast. RADIATION DOSE REDUCTION: This exam was performed according to the departmental dose-optimization program which includes automated exposure control, adjustment of the mA and/or kV according to patient size and/or use of iterative reconstruction technique. COMPARISON:  None. FINDINGS: Brain: No acute intracranial abnormality. Specifically, no hemorrhage, hydrocephalus, mass lesion, acute infarction, or significant intracranial injury. Vascular: No hyperdense vessel or unexpected calcification. Skull: No acute calvarial abnormality. Sinuses/Orbits: No acute finding Other: None IMPRESSION: No acute intracranial abnormality. Electronically Signed   By: Rolm Baptise M.D.   On: 12/18/2021 20:37   CT Orbits W Contrast  Result Date: 12/18/2021 CLINICAL DATA:  Initial evaluation for acute right-sided facial pain with right-sided blurry vision for 3 days. EXAM: CT ORBITS WITH CONTRAST TECHNIQUE: Multidetector CT images was performed according to the standard protocol following intravenous contrast administration. RADIATION DOSE REDUCTION: This exam was performed according to the departmental dose-optimization program which includes automated exposure control, adjustment of the mA and/or kV according to patient size and/or use of iterative reconstruction technique. CONTRAST:  37mL OMNIPAQUE IOHEXOL  300 MG/ML  SOLN COMPARISON:  None available. FINDINGS: Orbits: Globes are symmetric in size with normal appearance and morphology bilaterally. There is asymmetric enlargement with hyperenhancement and irregularity involving the right medial rectus muscle (series 3, image 25), suggesting acute orbital myositis. Surrounding inflammatory stranding with induration within the adjacent intraconal and extraconal fat. The right optic nerve is slightly bowed laterally. No tenting of the right globe. No loculated collections. Remainder of the extra-ocular muscles are normal. Superior orbital veins symmetric and normal. No extension through the orbital apex or visible cavernous sinus involvement. Contralateral left globe and orbit unremarkable. Visible paranasal sinuses: Minimal retained secretions noted within the right sphenoid sinus. Otherwise clear. Soft tissues: Visualized periorbital and facial soft tissues are unremarkable. Osseous: Unremarkable. Limited intracranial: Unremarkable. IMPRESSION: Asymmetric enlargement with hyperenhancement  involving the right medial rectus muscle, consistent with acute orbital myositis. This could be either infectious or inflammatory in nature. No abscess or drainable fluid collection. No visible intracranial extension at this time. Results were called by telephone at the time of interpretation on 12/18/2021 at 9:28 pm to provider [facility] Tad Fancher , who verbally acknowledged these results. Electronically Signed   By: Jeannine Boga M.D.   On: 12/18/2021 21:29    Procedures Procedures    Medications Ordered in ED Medications  fentaNYL (SUBLIMAZE) 50 MCG/ML injection (  Not Given 12/18/21 1813)  methylPREDNISolone sodium succinate (SOLU-MEDROL) 125 mg/2 mL injection 125 mg (has no administration in time range)  HYDROmorphone (DILAUDID) injection 0.5 mg (has no administration in time range)  cefTRIAXone (ROCEPHIN) 2 g in sodium chloride 0.9 % 100 mL IVPB (has no administration in  time range)  tetracaine (PONTOCAINE) 0.5 % ophthalmic solution 2 drop (2 drops Right Eye Given 12/18/21 1812)  fluorescein ophthalmic strip 1 strip (1 strip Right Eye Given 12/18/21 1812)  fentaNYL (SUBLIMAZE) injection 50 mcg (50 mcg Intravenous Given 12/18/21 1812)  HYDROmorphone (DILAUDID) injection 0.5 mg (0.5 mg Intravenous Given 12/18/21 2005)  iohexol (OMNIPAQUE) 300 MG/ML solution 75 mL (75 mLs Intravenous Contrast Given 12/18/21 2034)    ED Course/ Medical Decision Making/ A&P Clinical Course as of 12/18/21 2151  Sun Dec 18, 2021  2108 CBC reviewed and interpreted with elevated white blood cell count at 14,600  [DR]  2109 Sedimentation rate(!) Sed rate reviewed and interpreted as elevation 35 [DR]  2109 C-reactive protein CRP reviewed interpreted as normal [DR]  8341 Basic metabolic panel(!) Basic metabolic panel reviewed and without evidence of acute abnormalities [DR]  2109 Resp Panel by RT-PCR (Flu A&B, Covid) Nasopharyngeal Swab Respiratory panel is negative [DR]  2150 Reviewed CT of orbits and noted inflammatory changes of the medial rectus muscle.  No acute abscess noted Discussed with Drs. Dover and Mcclintock Reviewed Dr. Elicia Lamp interpretation of CT orbits Reviewed Dr. Con Memos interpretation of brain [DR]    Clinical Course User Index [DR] Pattricia Boss, MD                           Medical Decision Making 41 year old female with right eye pain and photophobia with pain with eye movement Patient with recent infection thought to be viral followed by strep infection.  She was on antibiotics, however symptoms had seemed to resolve approximately 3 weeks ago. Here in the ED visual acuity was measured normal Extraocular movements of right eye are decreased especially abduction to the right of the right eye Intraocular pressure measured and 18 in right eye, 13 in left eye which is not affected Awaiting results of CT scan. Differential diagnosis includes acute globe  pain issues that would include an increased intraocular pressure, abrasions, eye infections Retrobulbar are etiologies such as infectious including abscess given recent infections,, inflammatory processes of the extraocular muscles, lesions and masses of the extra ocular space CT scan   Amount and/or Complexity of Data Reviewed Labs: ordered. Decision-making details documented in ED Course. Radiology: ordered. Discussion of management or test interpretation with external provider(s): Discussed patient's results of CT scan with radiologist Discussed with Dr. Lucianne Lei, on-call for ophthalmology Discussed with Dr. Kathrynn Humble Plan steroids, Rocephin, additional labs ordered as per Dr. Richard Miu request Dr. Lucianne Lei will call her tomorrow for recheck.  She is also given Dr. Richard Miu contact information   Risk Prescription drug management. Parenteral controlled substances. Decision  regarding hospitalization.  Critical Care Total time providing critical care: < 30 minutes          Final Clinical Impression(s) / ED Diagnoses Final diagnoses:  Inflammation of extraocular muscles, right    Rx / DC Orders ED Discharge Orders          Ordered    cefTRIAXone (ROCEPHIN) 1 g injection   Once,   Status:  Discontinued        12/18/21 2141    predniSONE (DELTASONE) 10 MG tablet  Daily        12/18/21 2142    oxyCODONE-acetaminophen (PERCOCET/ROXICET) 5-325 MG tablet  Every 6 hours PRN        12/18/21 2148    cephALEXin (KEFLEX) 500 MG capsule  4 times daily        12/18/21 2148              Pattricia Boss, MD 12/18/21 2151

## 2021-12-18 NOTE — ED Notes (Signed)
Pt is here for right facial pain, dizziness, headache and nausea since 3-4 days ago. Seen at UC this AM and was given Toradol, Ativan and Zofran with no relief. Pt was diagnosed with migraine and was sent home. Pt denies hx of migraine. Alert and oriented x 4. Neuro intact. Will continue to monitor.

## 2021-12-18 NOTE — ED Triage Notes (Signed)
Reports dizziness, headache, right facial pain with blurry vision to right eye x 3 days. Seen at UC this AM and dx with migraine. Alert and oriented x 4.

## 2021-12-18 NOTE — ED Notes (Signed)
Husband Randa Evens [phone] would like an update

## 2021-12-19 LAB — RPR: RPR Ser Ql: NONREACTIVE

## 2021-12-19 LAB — SEDIMENTATION RATE: Sed Rate: 13 mm/hr (ref 0–22)

## 2021-12-20 LAB — ANGIOTENSIN CONVERTING ENZYME: Angiotensin-Converting Enzyme: 36 U/L (ref 14–82)

## 2021-12-22 LAB — QUANTIFERON-TB GOLD PLUS (RQFGPL)
QuantiFERON Mitogen Value: 0.05 IU/mL
QuantiFERON Nil Value: 0 IU/mL
QuantiFERON TB1 Ag Value: 0 IU/mL
QuantiFERON TB2 Ag Value: 0 IU/mL

## 2021-12-22 LAB — QUANTIFERON-TB GOLD PLUS: QuantiFERON-TB Gold Plus: UNDETERMINED — AB

## 2022-09-08 ENCOUNTER — Encounter: Payer: Self-pay | Admitting: Physician Assistant

## 2022-09-21 IMAGING — CT CT HEAD W/O CM
3 of 4 series · 16 of 47 positions shown, 19 images · non-contrast
Comparison: None.

CLINICAL DATA: Headache, sudden, severe. Dizziness, headache, right
facial pain, blurred vision



[Series 3: head 2.0 h70h · axial · 0.44mm/px · z∈[-134,+10]mm · 10 of 80 slices shown, 13 images]
[im 4/80  brain]
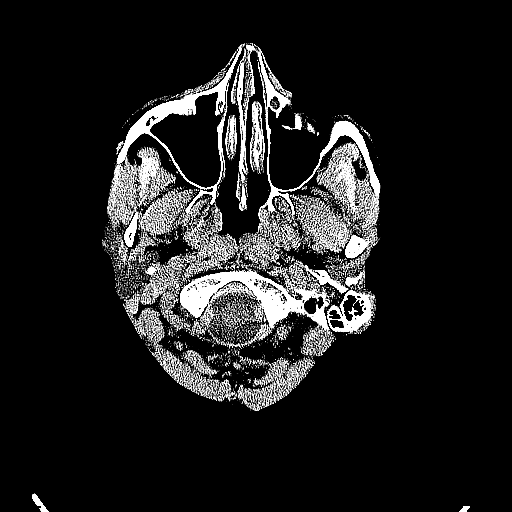
[im 4/80  bone]
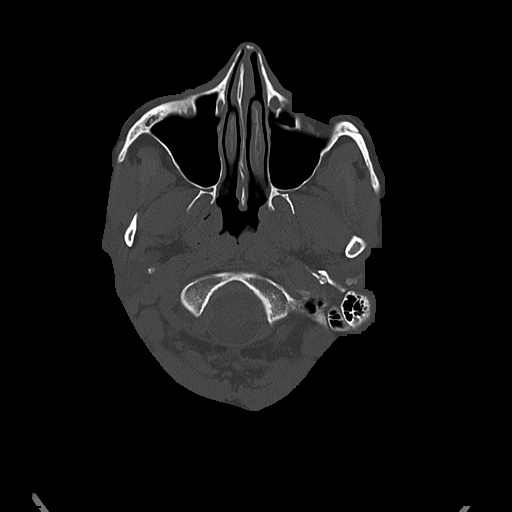
[im 12/80  brain]
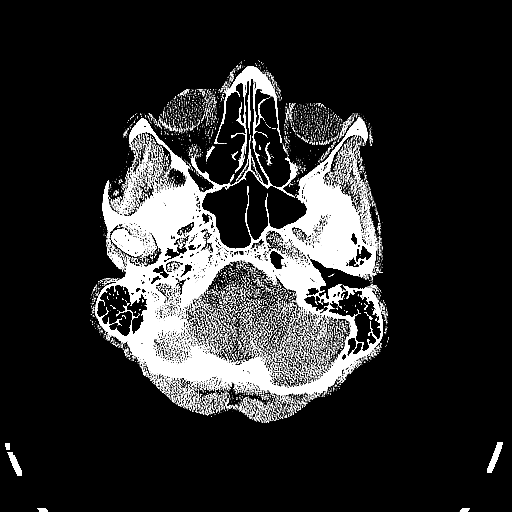
[im 20/80  brain]
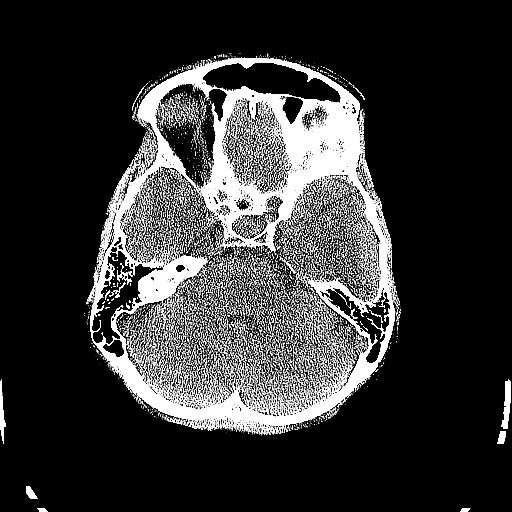
[im 28/80  brain]
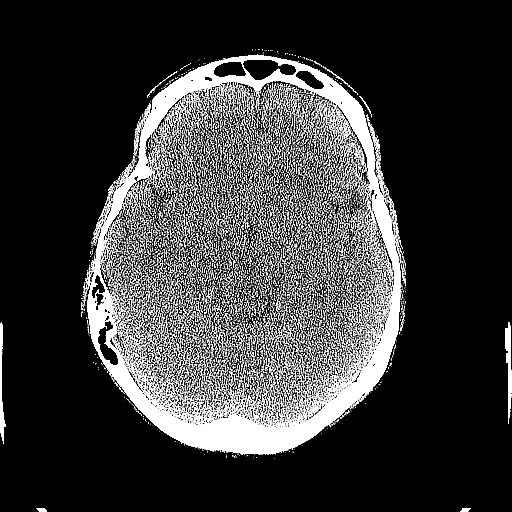
[im 36/80  brain]
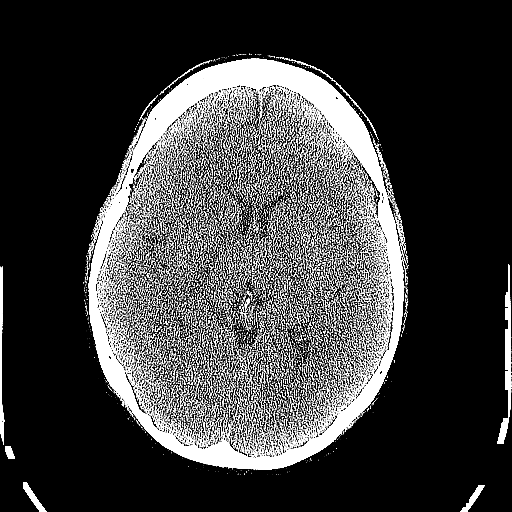
[im 36/80  bone]
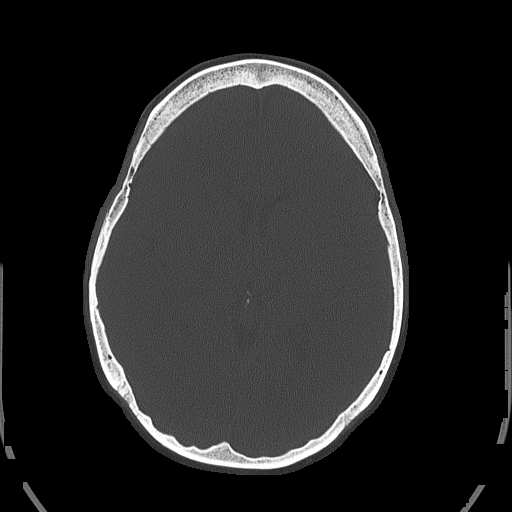
[im 44/80  brain]
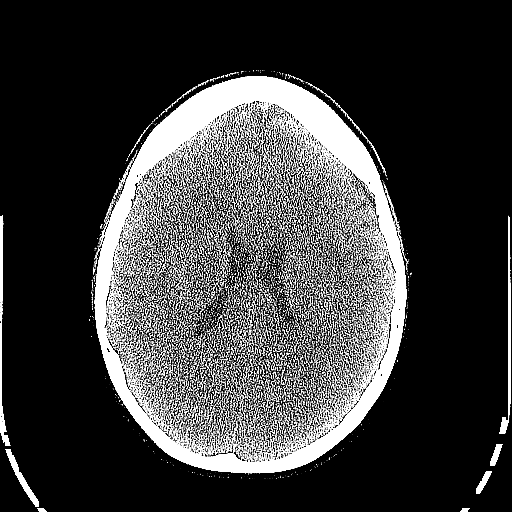
[im 52/80  brain]
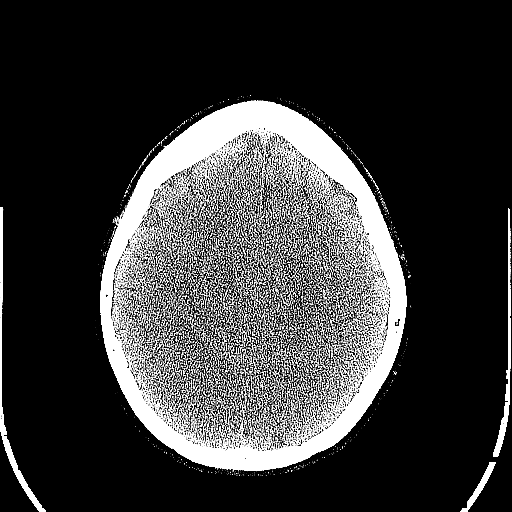
[im 60/80  brain]
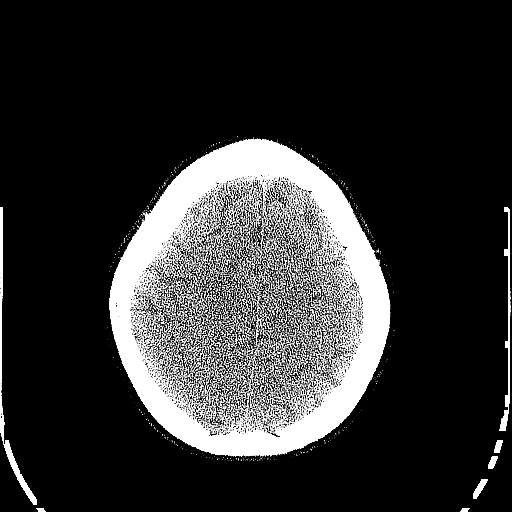
[im 68/80  brain]
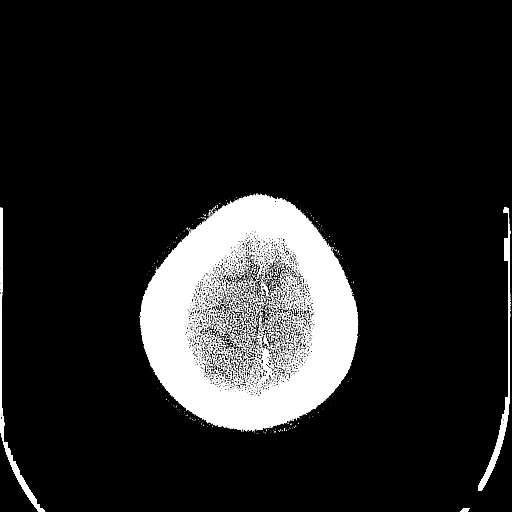
[im 68/80  bone]
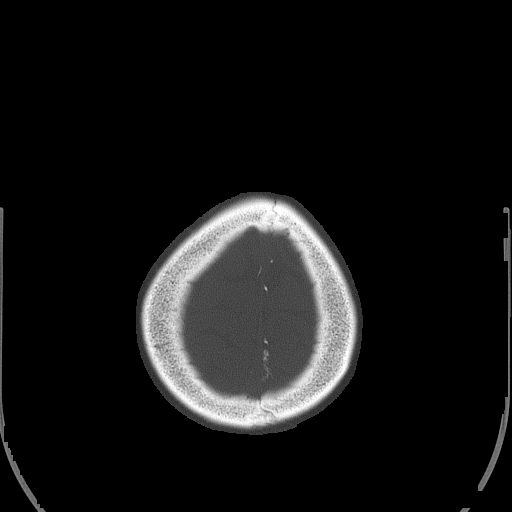
[im 76/80  brain]
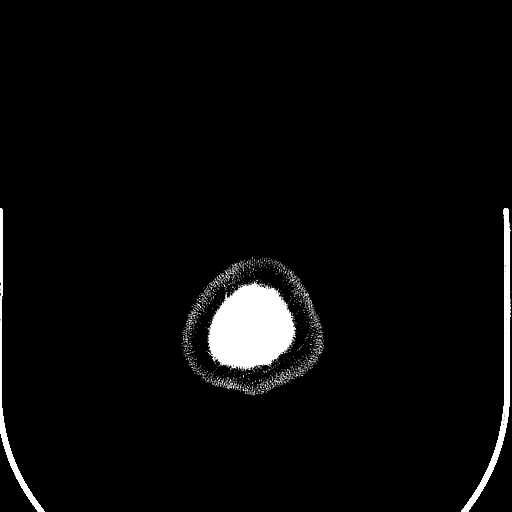

[Series 5: head 3.0 mpr cor · coronal · 0.32mm/px · 3 of 67 slices shown]
[im 23/67  brain]
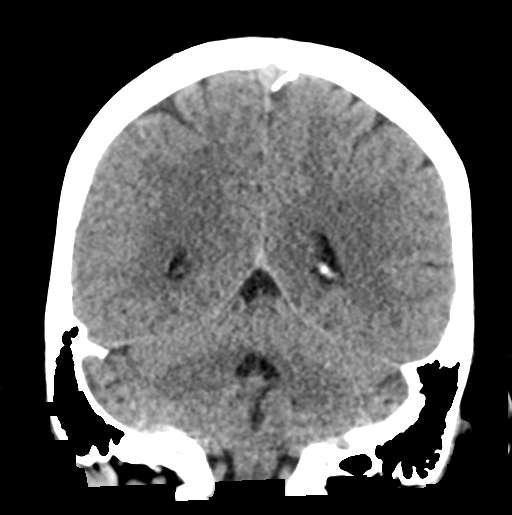
[im 30/67  brain]
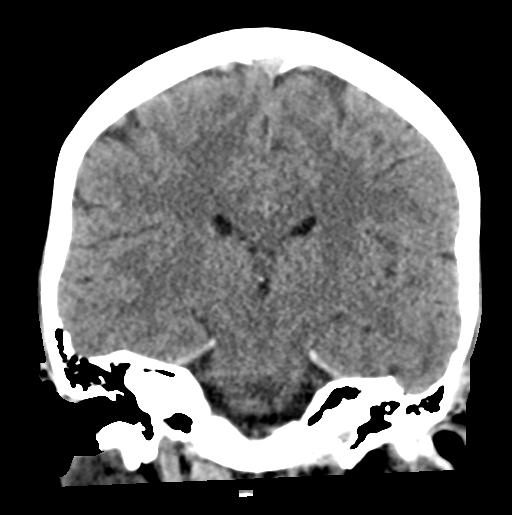
[im 37/67  brain]
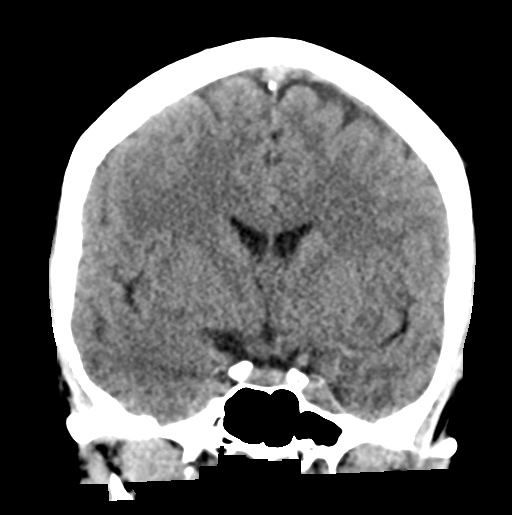

[Series 6: head 3.0 mpr sag · sagittal · 0.32mm/px · 3 of 54 slices shown]
[im 18/54  brain]
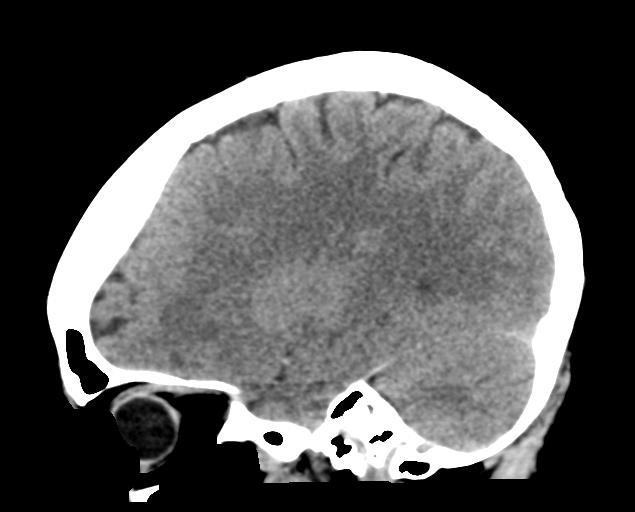
[im 27/54  brain]
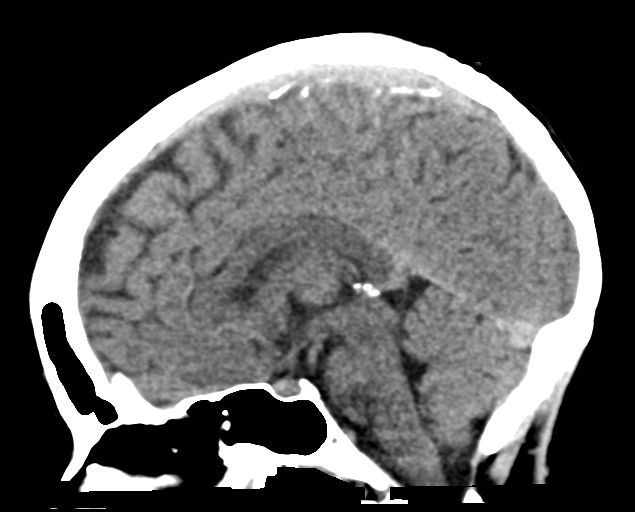
[im 36/54  brain]
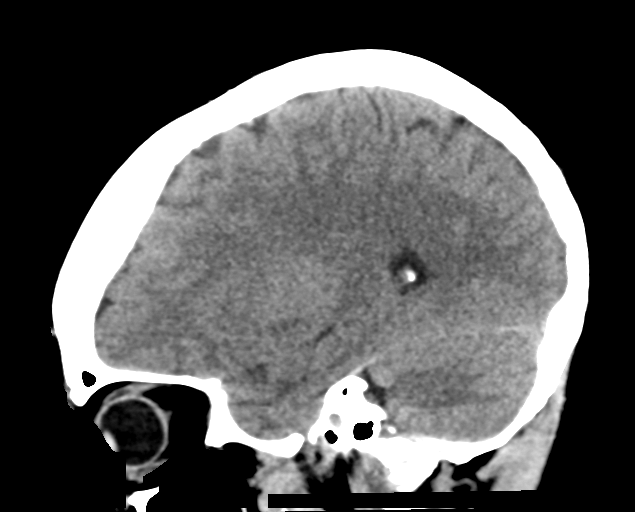

[16 of 47 positions shown; findings below may reference images not displayed]

FINDINGS: Brain: No acute intracranial abnormality. Specifically, no
hemorrhage, hydrocephalus, mass lesion, acute infarction, or
significant intracranial injury.

Vascular: No hyperdense vessel or unexpected calcification.

Skull: No acute calvarial abnormality.

Sinuses/Orbits: No acute finding

Other: None
IMPRESSION: No acute intracranial abnormality.

## 2022-10-13 NOTE — Progress Notes (Unsigned)
10/13/2022 Brenda Bryant [id number] 1981/03/15  Referring provider: Arnetha Gula, MD Primary GI doctor: {acdocs:27040}  ASSESSMENT AND PLAN:   There are no diagnoses linked to this encounter.   Patient Care Team: Arnetha Gula, MD as PCP - General (Family Medicine)  HISTORY OF PRESENT ILLNESS: 41 y.o. female with a past medical history of hypothyroidism, history of Lyme disease and others listed below presents for evaluation of colonoscopy due to family history.   2/23 labs reviewed WBC 14.6 hemoglobin 13.6 MCV 95.9, platelets 248 Normal kidney, INR normal.  Negative CRP and sed rate.TB Gold negative.   She  reports that she has never smoked. She has never used smokeless tobacco. She reports that she does not drink alcohol and does not use drugs.  Current Medications:   Current Outpatient Medications (Endocrine & Metabolic):    predniSONE (DELTASONE) 10 MG tablet, Take 2 tablets (20 mg total) by mouth daily.    Current Outpatient Medications (Analgesics):    ibuprofen (ADVIL,MOTRIN) 600 MG tablet, Take 1 tablet (600 mg total) by mouth every 6 (six) hours.   oxyCODONE-acetaminophen (PERCOCET/ROXICET) 5-325 MG tablet, Take 1 tablet by mouth every 6 (six) hours as needed for severe pain.   Current Outpatient Medications (Other):    calcium carbonate (TUMS - DOSED IN MG ELEMENTAL CALCIUM) 500 MG chewable tablet, Chew 1-2 tablets by mouth daily as needed for indigestion or heartburn.   cephALEXin (KEFLEX) 500 MG capsule, Take 1 capsule (500 mg total) by mouth 4 (four) times daily.   Prenatal Vit-Fe Fumarate-FA (PRENATAL MULTIVITAMIN) TABS tablet, Take 1 tablet by mouth daily at 12 noon.   Probiotic Product (PROBIOTIC PO), Take 1 capsule by mouth daily.  Medical History:  Past Medical History:  Diagnosis Date   Hypothyroidism    Lyme disease    Allergies: No Known Allergies   Surgical History:  She  has a past surgical history that includes No past  surgeries. Family History:  Her family history is not on file.  REVIEW OF SYSTEMS  : All other systems reviewed and negative except where noted in the History of Present Illness.  PHYSICAL EXAM: There were no vitals taken for this visit. General:   Pleasant, well developed female in no acute distress Head:   Normocephalic and atraumatic. Eyes:  sclerae anicteric,conjunctive pink  Heart:   {HEART EXAM HEM/ONC:21750} Pulm:  Clear anteriorly; no wheezing Abdomen:   {BlankSingle:19197::"Distended","Ridged","Soft"}, {BlankSingle:19197::"Flat","Obese","Non-distended"} AB, {BlankSingle:19197::"Absent","Hyperactive, tinkling","Hypoactive","Sluggish","Active"} bowel sounds. {actendernessAB:27319} tenderness {anatomy; site abdomen:5010}. {BlankMultiple:19196::"Without guarding","With guarding","Without rebound","With rebound"}, No organomegaly appreciated. Rectal: {acrectalexam:27461} Extremities:  {With/Without:304960234} edema. Msk: Symmetrical without gross deformities. Peripheral pulses intact.  Neurologic:  Alert and  oriented x4;  No focal deficits.  Skin:   Dry and intact without significant lesions or rashes. Psychiatric:  Cooperative. Normal mood and affect.  RELEVANT LABS AND IMAGING: CBC    Component Value Date/Time   WBC 14.6 (H) 12/18/2021 1817   RBC 4.13 12/18/2021 1817   HGB 13.6 12/18/2021 1817   HGB 14.2 05/08/2013 0000   HCT 39.6 12/18/2021 1817   HCT 39 05/08/2013 0000   PLT 248 12/18/2021 1817   PLT 267 05/08/2013 0000   MCV 95.9 12/18/2021 1817   MCH 32.9 12/18/2021 1817   MCHC 34.3 12/18/2021 1817   RDW 12.1 12/18/2021 1817   LYMPHSABS 1.5 12/18/2021 1817   MONOABS 0.6 12/18/2021 1817   EOSABS 0.1 12/18/2021 1817   BASOSABS 0.1 12/18/2021 1817    CMP     Component  Value Date/Time   NA 137 12/18/2021 1817   K 4.1 12/18/2021 1817   CL 105 12/18/2021 1817   CO2 24 12/18/2021 1817   GLUCOSE 118 (H) 12/18/2021 1817   BUN 9 12/18/2021 1817   CREATININE 0.63  12/18/2021 1817   CALCIUM 9.0 12/18/2021 1817   GFRNONAA >60 12/18/2021 1817   GFRAA >60 01/20/2017 [place] [name], PA-C 1:44 PM

## 2022-10-17 ENCOUNTER — Other Ambulatory Visit (INDEPENDENT_AMBULATORY_CARE_PROVIDER_SITE_OTHER): Payer: No Typology Code available for payment source

## 2022-10-17 ENCOUNTER — Encounter: Payer: Self-pay | Admitting: Physician Assistant

## 2022-10-17 ENCOUNTER — Ambulatory Visit (INDEPENDENT_AMBULATORY_CARE_PROVIDER_SITE_OTHER): Payer: No Typology Code available for payment source | Admitting: Physician Assistant

## 2022-10-17 DIAGNOSIS — R1084 Generalized abdominal pain: Secondary | ICD-10-CM

## 2022-10-17 DIAGNOSIS — M545 Low back pain, unspecified: Secondary | ICD-10-CM

## 2022-10-17 DIAGNOSIS — Z83719 Family history of colon polyps, unspecified: Secondary | ICD-10-CM

## 2022-10-17 DIAGNOSIS — R140 Abdominal distension (gaseous): Secondary | ICD-10-CM | POA: Diagnosis not present

## 2022-10-17 DIAGNOSIS — G8929 Other chronic pain: Secondary | ICD-10-CM

## 2022-10-17 LAB — COMPREHENSIVE METABOLIC PANEL
ALT: 10 U/L (ref 0–35)
AST: 14 U/L (ref 0–37)
Albumin: 4.6 g/dL (ref 3.5–5.2)
Alkaline Phosphatase: 57 U/L (ref 39–117)
BUN: 8 mg/dL (ref 6–23)
CO2: 29 mEq/L (ref 19–32)
Calcium: 9.6 mg/dL (ref 8.4–10.5)
Chloride: 103 mEq/L (ref 96–112)
Creatinine, Ser: 0.62 mg/dL (ref 0.40–1.20)
GFR: 109.75 mL/min (ref >60.00)
Glucose, Bld: 95 mg/dL (ref 70–99)
Potassium: 4.4 mEq/L (ref 3.5–5.1)
Sodium: 138 mEq/L (ref 135–145)
Total Bilirubin: 0.7 mg/dL (ref 0.2–1.2)
Total Protein: 7.1 g/dL (ref 6.0–8.3)

## 2022-10-17 LAB — CBC WITH DIFFERENTIAL/PLATELET
Basophils Absolute: 0.1 10*3/uL (ref 0.0–0.1)
Basophils Relative: 1.1 % (ref 0.0–3.0)
Eosinophils Absolute: 0.1 10*3/uL (ref 0.0–0.7)
Eosinophils Relative: 1.4 % (ref 0.0–5.0)
HCT: 39.7 % (ref 36.0–46.0)
Hemoglobin: 13.7 g/dL (ref 12.0–15.0)
Lymphocytes Relative: 24.4 % (ref 12.0–46.0)
Lymphs Abs: 1.9 10*3/uL (ref 0.7–4.0)
MCHC: 34.5 g/dL (ref 30.0–36.0)
MCV: 95.6 fl (ref 78.0–100.0)
Monocytes Absolute: 0.4 10*3/uL (ref 0.1–1.0)
Monocytes Relative: 5.2 % (ref 3.0–12.0)
Neutro Abs: 5.3 10*3/uL (ref 1.4–7.7)
Neutrophils Relative %: 67.9 % (ref 43.0–77.0)
Platelets: 271 10*3/uL (ref 150.0–400.0)
RBC: 4.15 Mil/uL (ref 3.87–5.11)
RDW: 12.7 % (ref 11.5–15.5)
WBC: 7.8 10*3/uL (ref 4.0–10.5)

## 2022-10-17 LAB — HIGH SENSITIVITY CRP: CRP, High Sensitivity: 0.57 mg/L (ref 0.000–5.000)

## 2022-10-17 LAB — SEDIMENTATION RATE: Sed Rate: 7 mm/hr (ref 0–20)

## 2022-10-17 MED ORDER — NA SULFATE-K SULFATE-MG SULF 17.5-3.13-1.6 GM/177ML PO SOLN: 1.0000 | ORAL | 0 refills | Status: DC

## 2022-10-17 NOTE — Patient Instructions (Addendum)
If you are age 42 or younger, your body mass index should be between 19-25. Your Body mass index is 23.69 kg/m. If this is out of the aformentioned range listed, please consider follow up with your Primary Care Provider.  ________________________________________________________  The [place] [facility] providers would like to encourage you to use [facility] to communicate with providers for non-urgent requests or questions.  Due to long hold times on the telephone, sending your provider a message by [facility] may be a faster and more efficient way to get a response.  Please allow 48 business hours for a response.  Please remember that this is for non-urgent requests.  _______________________________________________________  Your provider has requested that you go to the basement level for lab work before leaving today. Press "B" on the elevator. The lab is located at the first door on the left as you exit the elevator.  First do a trial off milk/lactose products if you use them.  Add fiber like benefiber or citracel once a day Increase activity  Can do trial of IBGard which is over the counter for AB pain- Take 1-2 capsules once a day for maintence or twice a day during a flare   You have been scheduled for a colonoscopy. Please follow written instructions given to you at your visit today.  Please pick up your prep supplies at the pharmacy within the next 1-3 days. If you use inhalers (even only as needed), please bring them with you on the day of your procedure.   Please try low FODMAP diet- see below- start with eliminating just one column at a time, the table at the very bottom contains foods that are safe to take   FODMAP stands for fermentable oligo-, di-, mono-saccharides and polyols (1). These are the scientific terms used to classify groups of carbs that are notorious for triggering digestive symptoms like bloating, gas and stomach pain.     Due to recent changes in healthcare laws, you may see  the results of your imaging and laboratory studies on MyChart before your provider has had a chance to review them.  We understand that in some cases there may be results that are confusing or concerning to you. Not all laboratory results come back in the same time frame and the provider may be waiting for multiple results in order to interpret others.  Please give [place] 48 hours in order for your provider to thoroughly review all the results before contacting the office for clarification of your results.   Thank you for entrusting me with your care and choosing [facility].  Vicie Mutters, PA-C

## 2022-10-17 NOTE — Progress Notes (Signed)
I agree with the assessment and plan as outlined by Ms. Collier. 

## 2022-10-19 LAB — EXTRA LAV TOP TUBE

## 2022-10-19 LAB — TISSUE TRANSGLUTAMINASE, IGA: (tTG) Ab, IgA: <1 U/mL

## 2022-10-19 LAB — HLA-B27 ANTIGEN: HLA-B27 Antigen: NEGATIVE

## 2022-10-19 LAB — IGA: Immunoglobulin A: 162 mg/dL (ref 47–310)

## 2022-12-07 ENCOUNTER — Encounter: Payer: Self-pay | Admitting: Certified Registered Nurse Anesthetist

## 2022-12-13 ENCOUNTER — Ambulatory Visit (AMBULATORY_SURGERY_CENTER): Payer: 59 | Admitting: Internal Medicine

## 2022-12-13 ENCOUNTER — Encounter: Payer: Self-pay | Admitting: Internal Medicine

## 2022-12-13 VITALS — BP 110/72 | HR 64 | Temp 98.4°F | Resp 10 | Ht 64.0 in | Wt 138.0 lb

## 2022-12-13 DIAGNOSIS — D120 Benign neoplasm of cecum: Secondary | ICD-10-CM | POA: Diagnosis not present

## 2022-12-13 DIAGNOSIS — Z1211 Encounter for screening for malignant neoplasm of colon: Secondary | ICD-10-CM | POA: Diagnosis present

## 2022-12-13 DIAGNOSIS — K5140 Inflammatory polyps of colon without complications: Secondary | ICD-10-CM | POA: Diagnosis not present

## 2022-12-13 DIAGNOSIS — D122 Benign neoplasm of ascending colon: Secondary | ICD-10-CM | POA: Diagnosis not present

## 2022-12-13 DIAGNOSIS — R1084 Generalized abdominal pain: Secondary | ICD-10-CM

## 2022-12-13 DIAGNOSIS — Z83719 Family history of colon polyps, unspecified: Secondary | ICD-10-CM | POA: Diagnosis not present

## 2022-12-13 DIAGNOSIS — R140 Abdominal distension (gaseous): Secondary | ICD-10-CM

## 2022-12-13 HISTORY — PX: COLONOSCOPY: SHX174

## 2022-12-13 MED ORDER — SODIUM CHLORIDE 0.9 % IV SOLN: 500.0000 mL | Freq: Once | INTRAVENOUS | Status: DC

## 2022-12-13 NOTE — Patient Instructions (Addendum)
Resume your previous medications today.  Read all of the handouts given to you by your recovery room nurse.  YOU HAD AN ENDOSCOPIC PROCEDURE TODAY AT [facility]:   Refer to the procedure report that was given to you for any specific questions about what was found during the examination.  If the procedure report does not answer your questions, please call your gastroenterologist to clarify.  If you requested that your care partner not be given the details of your procedure findings, then the procedure report has been included in a sealed envelope for you to review at your convenience later.  YOU SHOULD EXPECT: Some feelings of bloating in the abdomen. Passage of more gas than usual.  Walking can help get rid of the air that was put into your GI tract during the procedure and reduce the bloating. If you had a lower endoscopy (such as a colonoscopy or flexible sigmoidoscopy) you may notice spotting of blood in your stool or on the toilet paper. If you underwent a bowel prep for your procedure, you may not have a normal bowel movement for a few days.  Please Note:  You might notice some irritation and congestion in your nose or some drainage.  This is from the oxygen used during your procedure.  There is no need for concern and it should clear up in a day or so.  SYMPTOMS TO REPORT IMMEDIATELY:  Following lower endoscopy (colonoscopy or flexible sigmoidoscopy):  Excessive amounts of blood in the stool  Significant tenderness or worsening of abdominal pains  Swelling of the abdomen that is new, acute  Fever of 100F or higher  For urgent or emergent issues, a gastroenterologist can be reached at any hour by calling [phone]. Do not use MyChart messaging for urgent concerns.    DIET:  We do recommend a small meal at first, but then you may proceed to your regular diet.  Drink plenty of fluids but you should avoid alcoholic beverages for 24 hours.  ACTIVITY:  You should plan to  take it easy for the rest of today and you should NOT DRIVE or use heavy machinery until tomorrow (because of the sedation medicines used during the test).    FOLLOW UP: Our staff will call the number listed on your records the next business day following your procedure.  We will call around 7:15- 8:00 am to check on you and address any questions or concerns that you may have regarding the information given to you following your procedure. If we do not reach you, we will leave a message.     If any biopsies were taken you will be contacted by phone or by letter within the next 1-3 weeks.  Please call us at [phone] if you have not heard about the biopsies in 3 weeks.    SIGNATURES/CONFIDENTIALITY: You and/or your care partner have signed paperwork which will be entered into your electronic medical record.  These signatures attest to the fact that that the information above on your After Visit Summary has been reviewed and is understood.  Full responsibility of the confidentiality of this discharge information lies with you and/or your care-partner.

## 2022-12-13 NOTE — Op Note (Signed)
[place] Patient Name: Brenda Bryant Procedure Date: 12/13/2022 9:03 AM MRN: [id number] Endoscopist: Georgian Co , , [id number] Age: 42 Referring MD:  Date of Birth: 1981/10/29 Gender: Female Account #: [id number] Procedure:                Colonoscopy Indications:              Colon cancer screening in patient at increased                            risk: Family history of 1st-degree relative with                            colon polyps before age 6 years Medicines:                Monitored Anesthesia Care Procedure:                Pre-Anesthesia Assessment:                           - Prior to the procedure, a History and Physical                            was performed, and patient medications and                            allergies were reviewed. The patient's tolerance of                            previous anesthesia was also reviewed. The risks                            and benefits of the procedure and the sedation                            options and risks were discussed with the patient.                            All questions were answered, and informed consent                            was obtained. Prior Anticoagulants: The patient has                            taken no anticoagulant or antiplatelet agents. ASA                            Grade Assessment: II - A patient with mild systemic                            disease. After reviewing the risks and benefits,                            the patient was deemed in satisfactory condition to  undergo the procedure.                           After obtaining informed consent, the colonoscope                            was passed under direct vision. Throughout the                            procedure, the patient's blood pressure, pulse, and                            oxygen saturations were monitored continuously. The                            [id number]-HQ190L  Colonoscope [id number] was introduced                            through the anus and advanced to the the terminal                            ileum. The colonoscopy was performed without                            difficulty. The patient tolerated the procedure                            well. The quality of the bowel preparation was                            good. The terminal ileum, ileocecal valve,                            appendiceal orifice, and rectum were photographed. Scope In: 9:08:20 AM Scope Out: 9:25:04 AM Scope Withdrawal Time: 0 hours 13 minutes 51 seconds  Total Procedure Duration: 0 hours 16 minutes 44 seconds  Findings:                 The terminal ileum appeared normal.                           Two sessile polyps were found in the ascending                            colon and cecum. The polyps were 3 to 6 mm in size.                            These polyps were removed with a cold snare.                            Resection and retrieval were complete. Complications:            No immediate complications. Estimated Blood Loss:     Estimated blood loss was minimal. Impression:               -  The examined portion of the ileum was normal.                           - Two 3 to 6 mm polyps in the ascending colon and                            in the cecum, removed with a cold snare. Resected                            and retrieved. Recommendation:           - Discharge patient to home (with escort).                           - Await pathology results.                           - The findings and recommendations were discussed                            with the patient. Dr Georgian Co "Lyndee Leo" Lorenso Courier,  12/13/2022 9:30:57 AM

## 2022-12-13 NOTE — Progress Notes (Signed)
Pt's states no medical or surgical changes since previsit or office visit. 

## 2022-12-13 NOTE — Progress Notes (Signed)
GASTROENTEROLOGY PROCEDURE H&P NOTE   Primary Care Physician: Arnetha Gula, MD    Reason for Procedure:  Family history of colon polyps  Plan:    Colonoscopy  Patient is appropriate for endoscopic procedure(s) in the ambulatory ([place]) setting.  The nature of the procedure, as well as the risks, benefits, and alternatives were carefully and thoroughly reviewed with the patient. Ample time for discussion and questions allowed. The patient understood, was satisfied, and agreed to proceed.     HPI: Brenda Bryant is a 42 y.o. female who presents for colonoscopy for evaluation of family history of colon polyps .  Patient was most recently seen in the Gastroenterology Clinic on 10/17/22.  No interval change in medical history since that appointment. Please refer to that note for full details regarding GI history and clinical presentation.   Past Medical History:  Diagnosis Date   Hypothyroidism    Lyme disease     Past Surgical History:  Procedure Laterality Date   NO PAST SURGERIES      Prior to Admission medications   Medication Sig Start Date End Date Taking? Authorizing Provider  thyroid (NP THYROID) 30 MG tablet Take 1 tablet by mouth daily before breakfast.   Yes [provider]  ibuprofen (ADVIL,MOTRIN) 600 MG tablet Take 1 tablet (600 mg total) by mouth every 6 (six) hours. 05/30/15   Louretta Shorten, MD  Probiotic Product (PROBIOTIC PO) Take 1 capsule by mouth daily.    [provider]    Current Outpatient Medications  Medication Sig Dispense Refill   thyroid (NP THYROID) 30 MG tablet Take 1 tablet by mouth daily before breakfast.     ibuprofen (ADVIL,MOTRIN) 600 MG tablet Take 1 tablet (600 mg total) by mouth every 6 (six) hours. 30 tablet 0   Probiotic Product (PROBIOTIC PO) Take 1 capsule by mouth daily.     Current Facility-Administered Medications  Medication Dose Route Frequency Provider Last Rate Last Admin   0.9 %  sodium  chloride infusion  500 mL Intravenous Once Sharyn Creamer, MD        Allergies as of 12/13/2022   (No Known Allergies)    Family History  Problem Relation Age of Onset   Breast cancer Mother    Colon polyps Mother    Prostate cancer Father    Colon polyps Sister     Social History   Socioeconomic History   Marital status: Married    Spouse name: Not on file   Number of children: 3   Years of education: Not on file   Highest education level: Not on file  Occupational History   Not on file  Tobacco Use   Smoking status: Never   Smokeless tobacco: Never  Substance and Sexual Activity   Alcohol use: No   Drug use: No   Sexual activity: Yes  Other Topics Concern   Not on file  Social History Narrative   Not on file   Social Determinants of Health   Financial Resource Strain: Not on file  Food Insecurity: Not on file  Transportation Needs: Not on file  Physical Activity: Not on file  Stress: Not on file  Social Connections: Not on file  Intimate Partner Violence: Not on file    Physical Exam: Vital signs in last 24 hours: BP 116/71   Pulse 78   Temp 98.4 F (36.9 C)   Ht '5\' 4"'$  (1.626 m)   Wt 138 lb (62.6 kg)  SpO2 99%   BMI 23.69 kg/m  GEN: NAD EYE: Sclerae anicteric ENT: MMM CV: Non-tachycardic Pulm: No increased WOB GI: Soft NEURO:  Alert & Oriented   Christia Reading, MD [facility]   12/13/2022 8:56 AM

## 2022-12-13 NOTE — Progress Notes (Signed)
Called to room to assist during endoscopic procedure.  Patient ID and intended procedure confirmed with present staff. Received instructions for my participation in the procedure from the performing physician.  

## 2022-12-13 NOTE — Progress Notes (Signed)
Report given to PACU, vss 

## 2022-12-14 ENCOUNTER — Telehealth: Payer: Self-pay

## 2022-12-14 NOTE — Telephone Encounter (Signed)
  Follow up Call-     12/13/2022    8:19 AM  Call back number  Post procedure Call Back phone  # [phone]  Permission to leave phone message Yes     Patient questions:  Do you have a fever, pain , or abdominal swelling? No. Pain Score  0 *  Have you tolerated food without any problems? Yes.    Have you been able to return to your normal activities? Yes.    Do you have any questions about your discharge instructions: Diet   No. Medications  No. Follow up visit  No.  Do you have questions or concerns about your Care? No.  Actions: * If pain score is 4 or above: No action needed, pain <4.

## 2022-12-18 ENCOUNTER — Encounter: Payer: Self-pay | Admitting: Internal Medicine

## 2024-08-11 ENCOUNTER — Encounter (HOSPITAL_COMMUNITY): Payer: Self-pay | Admitting: Radiology

## 2024-08-11 ENCOUNTER — Emergency Department (HOSPITAL_COMMUNITY)

## 2024-08-11 ENCOUNTER — Emergency Department (HOSPITAL_COMMUNITY): Admission: EM | Admit: 2024-08-11 | Discharge: 2024-08-12 | Discharge status: Against Medical Advice

## 2024-08-11 ENCOUNTER — Other Ambulatory Visit: Payer: Self-pay

## 2024-08-11 DIAGNOSIS — R519 Headache, unspecified: Secondary | ICD-10-CM | POA: Insufficient documentation

## 2024-08-11 DIAGNOSIS — Z5321 Procedure and treatment not carried out due to patient leaving prior to being seen by health care provider: Secondary | ICD-10-CM | POA: Insufficient documentation

## 2024-08-11 DIAGNOSIS — Y9364 Activity, baseball: Secondary | ICD-10-CM | POA: Diagnosis not present

## 2024-08-11 DIAGNOSIS — S0993XA Unspecified injury of face, initial encounter: Secondary | ICD-10-CM | POA: Diagnosis present

## 2024-08-11 DIAGNOSIS — W2103XA Struck by baseball, initial encounter: Secondary | ICD-10-CM | POA: Insufficient documentation

## 2024-08-11 LAB — POC URINE PREG, ED: Preg Test, Ur: NEGATIVE

## 2024-08-11 MED ORDER — HYDROCODONE-ACETAMINOPHEN 5-325 MG PO TABS
2.0000 | ORAL_TABLET | Freq: Once | ORAL | Status: AC
  Administered 2024-08-11: 2 via ORAL
  Filled 2024-08-11: qty 2

## 2024-08-11 NOTE — ED Triage Notes (Signed)
 Patient reports she was hit in the face with a baseball tonight right between the eye brows causing significant nosebleed, now stopped. Patient denies blood thinners and LOC, states vision is normal but head is throbbing at this time. Bruising noted to upper nose area.

## 2024-08-11 NOTE — ED Provider Triage Note (Signed)
 Emergency Medicine Provider Triage Evaluation Note  Brenda Bryant , a 43 y.o. female  was evaluated in triage.  Pt complains of facial pain, struck in the face with a baseball while attending her child's baseball game.  Believes ball was deflected off to the ground prior to impact however struck her with significant force.  She does not endorse a loss of consciousness, however did report feeling dazed immediately thereafter.  Pain is primarily isolated to the middle of the face, denies having any visual difficulties..  Review of Systems  Positive: As above Negative:   Physical Exam  BP 124/82   Pulse 71   Temp 98.5 F (36.9 C) (Oral)   Resp 18   Ht 5' 4 (1.626 m)   Wt 63.5 kg   SpO2 99%   BMI 24.03 kg/m  Gen:   Awake, no distress   Resp:  Normal effort  MSK:   Moves extremities without difficulty  Other:  Tenderness and swelling noted to the nasal bridge, no septal hematoma appreciated, extraocular motions intact without pain.  Medical Decision Making  Medically screening exam initiated at 9:00 PM.  Appropriate orders placed.  Brenda Bryant was informed that the remainder of the evaluation will be completed by another provider, this initial triage assessment does not replace that evaluation, and the importance of remaining in the ED until their evaluation is complete.  Initial imaging and lab orders placed.   Brenda Bryant, Brenda Bryant 08/11/24 2101

## 2024-08-12 NOTE — ED Notes (Signed)
 Pt name called for updated vitals, no response

## 2024-08-20 ENCOUNTER — Encounter (HOSPITAL_COMMUNITY): Payer: Self-pay | Admitting: Otolaryngology

## 2024-08-20 ENCOUNTER — Other Ambulatory Visit: Payer: Self-pay | Admitting: Otolaryngology

## 2024-08-20 ENCOUNTER — Other Ambulatory Visit: Payer: Self-pay

## 2024-08-20 NOTE — Progress Notes (Signed)
 PCP - none Cardiologist - none  Chest x-ray - n/a EKG - n/a Stress Test - n/a ECHO - n/a Cardiac Cath - n/a  ICD Pacemaker/Loop - n/a  Sleep Study -  n/a  Diabetes - n/a  Aspirin & Blood Thinner Instructions:  n/a  Clear liquids til 0830 DOS.  Anesthesia review: no  STOP now taking any Aspirin (unless otherwise instructed by your surgeon), Aleve, Naproxen, Ibuprofen , Motrin , Advil , Goody's, BC's, all herbal medications, fish oil, and all vitamins.   Coronavirus Screening Do you have any of the following symptoms:  Cough yes/no: No Fever (>100.53F)  yes/no: No Runny nose yes/no: No Sore throat yes/no: No Difficulty breathing/shortness of breath  yes/no: No  Have you traveled in the last 14 days and where? yes/no: No  Patient verbalized understanding of instructions that were given via phone.

## 2024-08-22 ENCOUNTER — Ambulatory Visit (HOSPITAL_COMMUNITY)

## 2024-08-22 ENCOUNTER — Other Ambulatory Visit: Payer: Self-pay

## 2024-08-22 ENCOUNTER — Encounter (HOSPITAL_COMMUNITY)
Admission: RE | Disposition: A | Discharge status: Home / Self Care | Payer: Self-pay | Source: Home / Self Care | Attending: Otolaryngology

## 2024-08-22 ENCOUNTER — Encounter (HOSPITAL_COMMUNITY): Payer: Self-pay | Admitting: Otolaryngology

## 2024-08-22 ENCOUNTER — Ambulatory Visit (HOSPITAL_COMMUNITY)
Admission: RE | Admit: 2024-08-22 | Discharge: 2024-08-22 | Disposition: A | Discharge status: Home / Self Care | Attending: Otolaryngology | Admitting: Otolaryngology

## 2024-08-22 DIAGNOSIS — W2103XA Struck by baseball, initial encounter: Secondary | ICD-10-CM | POA: Insufficient documentation

## 2024-08-22 DIAGNOSIS — E039 Hypothyroidism, unspecified: Secondary | ICD-10-CM | POA: Diagnosis not present

## 2024-08-22 DIAGNOSIS — S022XXD Fracture of nasal bones, subsequent encounter for fracture with routine healing: Secondary | ICD-10-CM | POA: Diagnosis present

## 2024-08-22 DIAGNOSIS — S022XXA Fracture of nasal bones, initial encounter for closed fracture: Secondary | ICD-10-CM | POA: Diagnosis not present

## 2024-08-22 HISTORY — PX: CLOSED REDUCTION NASAL FRACTURE: SHX5365

## 2024-08-22 LAB — CBC
HCT: 37.5 % (ref 36.0–46.0)
Hemoglobin: 13.1 g/dL (ref 12.0–15.0)
MCH: 33.3 pg (ref 26.0–34.0)
MCHC: 34.9 g/dL (ref 30.0–36.0)
MCV: 95.4 fL (ref 80.0–100.0)
Platelets: 266 K/uL (ref 150–400)
RBC: 3.93 MIL/uL (ref 3.87–5.11)
RDW: 11.5 % (ref 11.5–15.5)
WBC: 7.3 K/uL (ref 4.0–10.5)
nRBC: 0 % (ref 0.0–0.2)

## 2024-08-22 LAB — POCT PREGNANCY, URINE: Preg Test, Ur: NEGATIVE

## 2024-08-22 SURGERY — CLOSED REDUCTION, FRACTURE, NASAL BONE
Anesthesia: General | Site: Nose | Laterality: Bilateral

## 2024-08-22 MED ORDER — SCOPOLAMINE 1 MG/3DAYS TD PT72
1.0000 | MEDICATED_PATCH | TRANSDERMAL | Status: DC
  Administered 2024-08-22: 1 mg via TRANSDERMAL
  Filled 2024-08-22: qty 1

## 2024-08-22 MED ORDER — OXYCODONE HCL 5 MG/5ML PO SOLN
5.0000 mg | Freq: Once | ORAL | Status: AC | PRN
  Administered 2024-08-22: 5 mg via ORAL

## 2024-08-22 MED ORDER — OXYCODONE HCL 5 MG PO TABS: 5.0000 mg | ORAL_TABLET | Freq: Once | ORAL | Status: AC | PRN

## 2024-08-22 MED ORDER — ROCURONIUM BROMIDE 10 MG/ML (PF) SYRINGE
PREFILLED_SYRINGE | INTRAVENOUS | Status: DC | PRN
  Administered 2024-08-22: 40 mg via INTRAVENOUS

## 2024-08-22 MED ORDER — PROPOFOL 1000 MG/100ML IV EMUL
INTRAVENOUS | Status: AC
  Filled 2024-08-22: qty 100

## 2024-08-22 MED ORDER — OXYMETAZOLINE HCL 0.05 % NA SOLN
NASAL | Status: AC
  Filled 2024-08-22: qty 30

## 2024-08-22 MED ORDER — ORAL CARE MOUTH RINSE: 15.0000 mL | Freq: Once | OROMUCOSAL | Status: AC

## 2024-08-22 MED ORDER — KETOROLAC TROMETHAMINE 30 MG/ML IJ SOLN
INTRAMUSCULAR | Status: AC
  Filled 2024-08-22: qty 1

## 2024-08-22 MED ORDER — SUGAMMADEX SODIUM 200 MG/2ML IV SOLN
INTRAVENOUS | Status: DC | PRN
  Administered 2024-08-22: 200 mg via INTRAVENOUS

## 2024-08-22 MED ORDER — ONDANSETRON HCL 4 MG/2ML IJ SOLN
INTRAMUSCULAR | Status: DC | PRN
  Administered 2024-08-22: 4 mg via INTRAVENOUS

## 2024-08-22 MED ORDER — FENTANYL CITRATE (PF) 100 MCG/2ML IJ SOLN
INTRAMUSCULAR | Status: AC
  Filled 2024-08-22: qty 2

## 2024-08-22 MED ORDER — OXYCODONE HCL 5 MG/5ML PO SOLN
ORAL | Status: AC
  Filled 2024-08-22: qty 5

## 2024-08-22 MED ORDER — DEXAMETHASONE SOD PHOSPHATE PF 10 MG/ML IJ SOLN
INTRAMUSCULAR | Status: DC | PRN
  Administered 2024-08-22: 10 mg via INTRAVENOUS

## 2024-08-22 MED ORDER — FENTANYL CITRATE (PF) 250 MCG/5ML IJ SOLN
INTRAMUSCULAR | Status: DC | PRN
  Administered 2024-08-22: 50 ug via INTRAVENOUS
  Administered 2024-08-22 (×2): 25 ug via INTRAVENOUS

## 2024-08-22 MED ORDER — ACETAMINOPHEN 10 MG/ML IV SOLN
INTRAVENOUS | Status: AC
  Filled 2024-08-22: qty 100

## 2024-08-22 MED ORDER — AMISULPRIDE (ANTIEMETIC) 5 MG/2ML IV SOLN: 10.0000 mg | Freq: Once | INTRAVENOUS | Status: DC | PRN

## 2024-08-22 MED ORDER — PROPOFOL 10 MG/ML IV BOLUS
INTRAVENOUS | Status: AC
  Filled 2024-08-22: qty 20

## 2024-08-22 MED ORDER — CHLORHEXIDINE GLUCONATE 0.12 % MT SOLN
15.0000 mL | Freq: Once | OROMUCOSAL | Status: AC
  Administered 2024-08-22: 15 mL via OROMUCOSAL
  Filled 2024-08-22: qty 15

## 2024-08-22 MED ORDER — FENTANYL CITRATE (PF) 100 MCG/2ML IJ SOLN
25.0000 ug | INTRAMUSCULAR | Status: DC | PRN
  Administered 2024-08-22 (×2): 50 ug via INTRAVENOUS

## 2024-08-22 MED ORDER — LIDOCAINE-EPINEPHRINE 1 %-1:100000 IJ SOLN
INTRAMUSCULAR | Status: AC
  Filled 2024-08-22: qty 1

## 2024-08-22 MED ORDER — MUPIROCIN 2 % EX OINT
TOPICAL_OINTMENT | CUTANEOUS | Status: AC
  Filled 2024-08-22: qty 22

## 2024-08-22 MED ORDER — LIDOCAINE 2% (20 MG/ML) 5 ML SYRINGE
INTRAMUSCULAR | Status: DC | PRN
  Administered 2024-08-22: 50 mg via INTRAVENOUS

## 2024-08-22 MED ORDER — PROPOFOL 10 MG/ML IV BOLUS
INTRAVENOUS | Status: DC | PRN
  Administered 2024-08-22: 150 mg via INTRAVENOUS
  Administered 2024-08-22: 150 ug/kg/min via INTRAVENOUS

## 2024-08-22 MED ORDER — MIDAZOLAM HCL (PF) 2 MG/2ML IJ SOLN
INTRAMUSCULAR | Status: DC | PRN
  Administered 2024-08-22: 2 mg via INTRAVENOUS

## 2024-08-22 MED ORDER — OXYMETAZOLINE HCL 0.05 % NA SOLN
NASAL | Status: DC | PRN
  Administered 2024-08-22: 1 via TOPICAL

## 2024-08-22 MED FILL — Midazolam HCl Inj 2 MG/2ML (Base Equivalent): INTRAMUSCULAR | Qty: 2 | Status: AC

## 2024-08-22 SURGICAL SUPPLY — 29 items
BAG COUNTER SPONGE SURGICOUNT (BAG) ×1 IMPLANT
BENZOIN TINCTURE PRP APPL 2/3 (GAUZE/BANDAGES/DRESSINGS) ×1 IMPLANT
BLADE SURG 15 STRL LF DISP TIS (BLADE) IMPLANT
CANISTER SUCTION 3000ML PPV (SUCTIONS) ×1 IMPLANT
COVER BACK TABLE 60X90IN (DRAPES) ×1 IMPLANT
COVER MAYO STAND STRL (DRAPES) ×1 IMPLANT
DRAPE HALF SHEET 40X57 (DRAPES) IMPLANT
GAUZE 4X4 16PLY ~~LOC~~+RFID DBL (SPONGE) ×1 IMPLANT
GAUZE SPONGE 2X2 8PLY STRL LF (GAUZE/BANDAGES/DRESSINGS) ×1 IMPLANT
GLOVE BIO SURGEON STRL SZ7.5 (GLOVE) ×1 IMPLANT
GOWN STRL REUS W/ TWL LRG LVL3 (GOWN DISPOSABLE) ×2 IMPLANT
KIT BASIN OR (CUSTOM PROCEDURE TRAY) ×1 IMPLANT
KIT TURNOVER KIT B (KITS) ×1 IMPLANT
NDL HYPO 25GX1X1/2 BEV (NEEDLE) ×1 IMPLANT
NEEDLE HYPO 25GX1X1/2 BEV (NEEDLE) ×1 IMPLANT
PAD ARMBOARD POSITIONER FOAM (MISCELLANEOUS) ×2 IMPLANT
PATTIES SURGICAL .5 X3 (DISPOSABLE) ×1 IMPLANT
POSITIONER HEAD DONUT 9IN (MISCELLANEOUS) IMPLANT
SOLN 0.9% NACL 1000 ML (IV SOLUTION) ×1 IMPLANT
SOLN 0.9% NACL POUR BTL 1000ML (IV SOLUTION) ×1 IMPLANT
SOLN STERILE WATER 1000 ML (IV SOLUTION) ×1 IMPLANT
SOLN STERILE WATER BTL 1000 ML (IV SOLUTION) ×1 IMPLANT
SPLINT NASAL DENVER LRG BLUSH (MISCELLANEOUS) ×1 IMPLANT
SPLINT NASAL DOYLE BI-VL (GAUZE/BANDAGES/DRESSINGS) IMPLANT
STRIP CLOSURE SKIN 1/2X4 (GAUZE/BANDAGES/DRESSINGS) ×1 IMPLANT
SUT CHROMIC 2 0 SH (SUTURE) IMPLANT
SYR CONTROL 10ML LL (SYRINGE) ×1 IMPLANT
TOWEL GREEN STERILE FF (TOWEL DISPOSABLE) ×1 IMPLANT
TUBE CONNECTING 12X1/4 (SUCTIONS) ×1 IMPLANT

## 2024-08-22 NOTE — Anesthesia Preprocedure Evaluation (Addendum)
 Anesthesia Evaluation  Patient identified by MRN, date of birth, ID band Patient awake    Reviewed: Allergy & Precautions, NPO status , Patient's Chart, lab work & pertinent test results  Airway Mallampati: II  TM Distance: >3 FB Neck ROM: Full    Dental no notable dental hx. (+) Teeth Intact, Dental Advisory Given   Pulmonary neg pulmonary ROS   Pulmonary exam normal breath sounds clear to auscultation       Cardiovascular negative cardio ROS Normal cardiovascular exam Rhythm:Regular Rate:Normal     Neuro/Psych negative neurological ROS  negative psych ROS   GI/Hepatic negative GI ROS, Neg liver ROS,,,  Endo/Other  Hypothyroidism    Renal/GU negative Renal ROS  negative genitourinary   Musculoskeletal negative musculoskeletal ROS (+)    Abdominal   Peds  Hematology negative hematology ROS (+)   Anesthesia Other Findings   Reproductive/Obstetrics                              Anesthesia Physical Anesthesia Plan  ASA: 2  Anesthesia Plan: General   Post-op Pain Management:    Induction: Intravenous  PONV Risk Score and Plan: 3 and Ondansetron , Dexamethasone, Midazolam and Scopolamine patch - Pre-op  Airway Management Planned: Oral ETT  Additional Equipment:   Intra-op Plan:   Post-operative Plan: Extubation in OR  Informed Consent: I have reviewed the patients History and Physical, chart, labs and discussed the procedure including the risks, benefits and alternatives for the proposed anesthesia with the patient or authorized representative who has indicated his/her understanding and acceptance.     Dental advisory given  Plan Discussed with: CRNA  Anesthesia Plan Comments:          Anesthesia Quick Evaluation

## 2024-08-22 NOTE — H&P (Signed)
 Brenda Bryant is an 43 y.o. female.   Chief Complaint: Nasal fracture HPI: 43 year old female was struck in face with a foul ball at a baseball game, sustaining a mildly displaced nasal fracture.  Past Medical History:  Diagnosis Date   Hypothyroidism    Lyme disease     Past Surgical History:  Procedure Laterality Date   COLONOSCOPY  12/13/2022   HYSTEROSALPINGOGRAM  11/14/2012   [facility]   WISDOM TOOTH EXTRACTION      Family History  Problem Relation Age of Onset   Breast cancer Mother    Colon polyps Mother    Prostate cancer Father    Colon polyps Sister    Social History:  reports that she has never smoked. She has never used smokeless tobacco. She reports that she does not drink alcohol and does not use drugs.  Allergies: No Known Allergies  Medications Prior to Admission  Medication Sig Dispense Refill   ibuprofen  (ADVIL ) 200 MG tablet Take 400 mg by mouth every 8 (eight) hours as needed (pain.).     Multiple Vitamin (MULTIVITAMIN WITH MINERALS) TABS tablet Take 1 tablet by mouth in the morning.     Probiotic Product (PROBIOTIC PO) Take 1 capsule by mouth in the morning.     progesterone (PROMETRIUM) 100 MG capsule Take 100 mg by mouth at bedtime.     thyroid (NP THYROID) 30 MG tablet Take 30 mg by mouth daily before breakfast.      Results for orders placed or performed during the hospital encounter of 08/22/24 (from the past 48 hours)  CBC per protocol     Status: None   Collection Time: 08/22/24  8:59 AM  Result Value Ref Range   WBC 7.3 4.0 - 10.5 K/uL   RBC 3.93 3.87 - 5.11 MIL/uL   Hemoglobin 13.1 12.0 - 15.0 g/dL   HCT 62.4 63.9 - 53.9 %   MCV 95.4 80.0 - 100.0 fL   MCH 33.3 26.0 - 34.0 pg   MCHC 34.9 30.0 - 36.0 g/dL   RDW 88.4 88.4 - 84.4 %   Platelets 266 150 - 400 K/uL   nRBC 0.0 0.0 - 0.2 %    Comment: Performed at [facility], [address], [place], [place] [address]  Pregnancy, urine POC     Status: None   Collection Time:  08/22/24  9:12 AM  Result Value Ref Range   Preg Test, Ur NEGATIVE NEGATIVE    Comment:        THE SENSITIVITY OF THIS METHODOLOGY IS >20 mIU/mL.    No results found.  Review of Systems  All other systems reviewed and are negative.   Blood pressure 105/71, pulse 67, temperature 98.2 F (36.8 C), temperature source Oral, resp. rate 17, height 5' 4 (1.626 m), weight 63.5 kg, SpO2 100%, unknown if currently breastfeeding. Physical Exam Constitutional:      Appearance: Normal appearance. She is normal weight.  HENT:     Head: Normocephalic and atraumatic.     Right Ear: External ear normal.     Left Ear: External ear normal.     Nose:     Comments: External nose with mild leftward deviation.    Mouth/Throat:     Mouth: Mucous membranes are moist.     Pharynx: Oropharynx is clear.  Eyes:     Extraocular Movements: Extraocular movements intact.     Pupils: Pupils are equal, round, and reactive to light.  Cardiovascular:  Rate and Rhythm: Normal rate.  Pulmonary:     Effort: Pulmonary effort is normal.  Neurological:     General: No focal deficit present.     Mental Status: She is alert and oriented to person, place, and time.  Psychiatric:        Mood and Affect: Mood normal.        Behavior: Behavior normal.        Thought Content: Thought content normal.        Judgment: Judgment normal.      Assessment/Plan Nasal fracture  To OR for closed nasal reduction.  Vaughan Ricker, MD 08/22/2024, 11:25 AM

## 2024-08-22 NOTE — Anesthesia Procedure Notes (Signed)
 Procedure Name: Intubation Date/Time: 08/22/2024 12:05 PM  Performed by: Jerl Donald LABOR, CRNAPre-anesthesia Checklist: Patient identified, Emergency Drugs available, Suction available and Patient being monitored Patient Re-evaluated:Patient Re-evaluated prior to induction Oxygen Delivery Method: Circle System Utilized Preoxygenation: Pre-oxygenation with 100% oxygen Induction Type: IV induction Ventilation: Mask ventilation without difficulty Laryngoscope Size: Mac and 3 Grade View: Grade I Tube type: Oral Tube size: 6.5 mm Number of attempts: 1 Airway Equipment and Method: Stylet Placement Confirmation: ETT inserted through vocal cords under direct vision, positive ETCO2 and breath sounds checked- equal and bilateral Secured at: 22 cm Tube secured with: Tape Dental Injury: Teeth and Oropharynx as per pre-operative assessment

## 2024-08-22 NOTE — Transfer of Care (Signed)
 Immediate Anesthesia Transfer of Care Note  Patient: Brenda Bryant  Procedure(s) Performed: CLOSED REDUCTION, FRACTURE, NASAL BONE (Bilateral: Nose)  Patient Location: PACU  Anesthesia Type:General  Level of Consciousness: awake, oriented, and drowsy  Airway & Oxygen Therapy: Patient Spontanous Breathing  Post-op Assessment: Report given to RN, Post -op Vital signs reviewed and stable, and Patient moving all extremities  Post vital signs: Reviewed and stable  Last Vitals:  Vitals Value Taken Time  BP 113/72 08/22/24 12:23  Temp    Pulse 85 08/22/24 12:25  Resp 14 08/22/24 12:25  SpO2 95 % 08/22/24 12:25  Vitals shown include unfiled device data.  Last Pain:  Vitals:   08/22/24 0923  TempSrc:   PainSc: 0-No pain         Complications: No notable events documented.

## 2024-08-22 NOTE — Op Note (Signed)
 Preop diagnosis: Displaced nasal fracture Postop diagnosis: same Procedure: Closed nasal reduction Surgeon: Carlie Anesth: General Compl: None Findings: External nose deviated mildly to the left. Description:  After discussing risks, benefits, and alternatives, the patient was brought to the operating room and placed on the operative table in the supine position.  Anesthesia was induced and the patient was intubated by the anesthesia team with an LMA without difficulty.  The eyes were taped closed.  Afrin-soaked pledgets were placed in both sides of the nose for several minutes.  After removal, the nose was reduced using a Goldman elevator with bimanual manipulation until the nasal bones looked symmetric.  Afrin-soaked pledgets were replaced.  The external nose was painted with Benzoin and custom-cut Steri-strips were laid over the nose.  The Thermaplast splint was cut to fit the nose and placed in hot water until malleable.  It was then laid over the nose until it hardened in place.  Pledgets were removed and the nasal passages were suctioned.  The patient was returned to anesthesia for wake up and was extubated and moved to the recovery room in stable condition.

## 2024-08-23 NOTE — Anesthesia Postprocedure Evaluation (Signed)
 Anesthesia Post Note  Patient: [place]  Procedure(s) Performed: CLOSED REDUCTION, FRACTURE, NASAL BONE (Bilateral: Nose)     Patient location during evaluation: PACU Anesthesia Type: General Level of consciousness: awake and alert Pain management: pain level controlled Vital Signs Assessment: post-procedure vital signs reviewed and stable Respiratory status: spontaneous breathing, nonlabored ventilation, respiratory function stable and patient connected to nasal cannula oxygen Cardiovascular status: blood pressure returned to baseline and stable Postop Assessment: no apparent nausea or vomiting Anesthetic complications: no   No notable events documented.  Last Vitals:  Vitals:   08/22/24 1245 08/22/24 1300  BP: 103/60 100/68  Pulse: 77 66  Resp: 14 13  Temp:  36.6 C  SpO2: 97% 100%    Last Pain:  Vitals:   08/22/24 1241  TempSrc:   PainSc: 5                  Olyvia Gopal L Euriah Matlack

## 2024-08-24 ENCOUNTER — Encounter (HOSPITAL_COMMUNITY): Payer: Self-pay | Admitting: Otolaryngology
